# Patient Record
Sex: Male | Born: 1935 | Race: Black or African American | Hispanic: No | Marital: Single | Smoking: Never smoker
Health system: Southern US, Community
[De-identification: ages and names within clinical notes are randomized; demographics above are authoritative.]

## PROBLEM LIST (undated history)

## (undated) DIAGNOSIS — N189 Chronic kidney disease, unspecified: Secondary | ICD-10-CM

## (undated) DIAGNOSIS — I10 Essential (primary) hypertension: Secondary | ICD-10-CM

## (undated) DIAGNOSIS — F1010 Alcohol abuse, uncomplicated: Secondary | ICD-10-CM

## (undated) HISTORY — PX: FOOT SURGERY: SHX648

---

## 2007-01-07 ENCOUNTER — Inpatient Hospital Stay: Payer: Self-pay | Admitting: *Deleted

## 2007-01-07 ENCOUNTER — Other Ambulatory Visit: Payer: Self-pay

## 2008-03-31 ENCOUNTER — Other Ambulatory Visit: Payer: Self-pay

## 2008-03-31 ENCOUNTER — Emergency Department: Payer: Self-pay | Admitting: Emergency Medicine

## 2008-07-26 ENCOUNTER — Emergency Department: Payer: Self-pay | Admitting: Emergency Medicine

## 2009-07-16 ENCOUNTER — Emergency Department: Payer: Self-pay | Admitting: Emergency Medicine

## 2009-07-20 ENCOUNTER — Inpatient Hospital Stay: Payer: Self-pay | Admitting: Orthopedic Surgery

## 2009-10-08 ENCOUNTER — Emergency Department: Payer: Self-pay | Admitting: Unknown Physician Specialty

## 2012-10-23 LAB — COMPREHENSIVE METABOLIC PANEL
Albumin: 3.3 g/dL — ABNORMAL LOW (ref 3.4–5.0)
Anion Gap: 16 (ref 7–16)
BUN: 74 mg/dL — ABNORMAL HIGH (ref 7–18)
Bilirubin,Total: 0.7 mg/dL (ref 0.2–1.0)
Chloride: 102 mmol/L (ref 98–107)
Creatinine: 13.88 mg/dL — ABNORMAL HIGH (ref 0.60–1.30)
EGFR (African American): 3 — ABNORMAL LOW
EGFR (Non-African Amer.): 3 — ABNORMAL LOW
Glucose: 92 mg/dL (ref 65–99)
Potassium: 4.5 mmol/L (ref 3.5–5.1)
SGOT(AST): 34 U/L (ref 15–37)
Sodium: 134 mmol/L — ABNORMAL LOW (ref 136–145)
Total Protein: 7.6 g/dL (ref 6.4–8.2)

## 2012-10-23 LAB — CBC WITH DIFFERENTIAL/PLATELET
Basophil #: 0 10*3/uL (ref 0.0–0.1)
Basophil %: 0.5 %
Eosinophil %: 0.2 %
Lymphocyte %: 3.3 %
MCH: 32.8 pg (ref 26.0–34.0)
Monocyte #: 0.1 x10 3/mm — ABNORMAL LOW (ref 0.2–1.0)
Monocyte %: 1.2 %
Neutrophil %: 94.8 %
Platelet: 129 10*3/uL — ABNORMAL LOW (ref 150–440)
RDW: 13.3 % (ref 11.5–14.5)

## 2012-10-23 LAB — URINALYSIS, COMPLETE
Bacteria: NONE SEEN
Glucose,UR: NEGATIVE mg/dL (ref 0–75)
Hyaline Cast: 5
Ketone: NEGATIVE
Nitrite: NEGATIVE
Protein: NEGATIVE
RBC,UR: 1 /HPF (ref 0–5)
Specific Gravity: 1.005 (ref 1.003–1.030)
WBC UR: 3 /HPF (ref 0–5)

## 2012-10-24 ENCOUNTER — Inpatient Hospital Stay: Payer: Self-pay | Admitting: Internal Medicine

## 2012-10-24 LAB — COMPREHENSIVE METABOLIC PANEL
Albumin: 3 g/dL — ABNORMAL LOW (ref 3.4–5.0)
Alkaline Phosphatase: 77 U/L (ref 50–136)
Anion Gap: 18 — ABNORMAL HIGH (ref 7–16)
BUN: 80 mg/dL — ABNORMAL HIGH (ref 7–18)
Bilirubin,Total: 0.6 mg/dL (ref 0.2–1.0)
Chloride: 103 mmol/L (ref 98–107)
Co2: 14 mmol/L — ABNORMAL LOW (ref 21–32)
Creatinine: 14.4 mg/dL — ABNORMAL HIGH (ref 0.60–1.30)
EGFR (African American): 3 — ABNORMAL LOW
EGFR (Non-African Amer.): 3 — ABNORMAL LOW
Osmolality: 295 (ref 275–301)
Potassium: 5 mmol/L (ref 3.5–5.1)
SGPT (ALT): 25 U/L (ref 12–78)
Sodium: 135 mmol/L — ABNORMAL LOW (ref 136–145)
Total Protein: 7 g/dL (ref 6.4–8.2)

## 2012-10-24 LAB — BASIC METABOLIC PANEL
BUN: 88 mg/dL — ABNORMAL HIGH (ref 7–18)
Calcium, Total: 8 mg/dL — ABNORMAL LOW (ref 8.5–10.1)
Co2: 17 mmol/L — ABNORMAL LOW (ref 21–32)
EGFR (African American): 3 — ABNORMAL LOW
EGFR (Non-African Amer.): 3 — ABNORMAL LOW
Glucose: 155 mg/dL — ABNORMAL HIGH (ref 65–99)
Potassium: 4.4 mmol/L (ref 3.5–5.1)
Sodium: 136 mmol/L (ref 136–145)

## 2012-10-24 LAB — CBC WITH DIFFERENTIAL/PLATELET
Basophil #: 0 10*3/uL (ref 0.0–0.1)
HGB: 11.5 g/dL — ABNORMAL LOW (ref 13.0–18.0)
Lymphocyte #: 0.5 10*3/uL — ABNORMAL LOW (ref 1.0–3.6)
MCH: 32.4 pg (ref 26.0–34.0)
MCHC: 34.1 g/dL (ref 32.0–36.0)
MCV: 95 fL (ref 80–100)
Monocyte #: 0 x10 3/mm — ABNORMAL LOW (ref 0.2–1.0)
Monocyte %: 0.9 %
Neutrophil #: 5.1 10*3/uL (ref 1.4–6.5)
Neutrophil %: 90.3 %
RBC: 3.55 10*6/uL — ABNORMAL LOW (ref 4.40–5.90)

## 2012-10-24 LAB — IRON AND TIBC
Iron Saturation: 41 %
Iron: 90 ug/dL (ref 65–175)
Unbound Iron-Bind.Cap.: 129 ug/dL

## 2012-10-24 LAB — PROTEIN / CREATININE RATIO, URINE: Protein/Creat. Ratio: 203 mg/gCREAT — ABNORMAL HIGH (ref 0–200)

## 2012-10-24 LAB — PHOSPHORUS: Phosphorus: 7.8 mg/dL — ABNORMAL HIGH (ref 2.5–4.9)

## 2012-10-25 LAB — BASIC METABOLIC PANEL
Anion Gap: 15 (ref 7–16)
BUN: 99 mg/dL — ABNORMAL HIGH (ref 7–18)
BUN: 94 mg/dL — ABNORMAL HIGH (ref 7–18)
Calcium, Total: 7.5 mg/dL — ABNORMAL LOW (ref 8.5–10.1)
Chloride: 108 mmol/L — ABNORMAL HIGH (ref 98–107)
Chloride: 110 mmol/L — ABNORMAL HIGH (ref 98–107)
Co2: 16 mmol/L — ABNORMAL LOW (ref 21–32)
Creatinine: 12.85 mg/dL — ABNORMAL HIGH (ref 0.60–1.30)
Creatinine: 10.91 mg/dL — ABNORMAL HIGH (ref 0.60–1.30)
EGFR (African American): 4 — ABNORMAL LOW
EGFR (African American): 5 — ABNORMAL LOW
EGFR (Non-African Amer.): 3 — ABNORMAL LOW
EGFR (Non-African Amer.): 4 — ABNORMAL LOW
Glucose: 164 mg/dL — ABNORMAL HIGH (ref 65–99)
Glucose: 174 mg/dL — ABNORMAL HIGH (ref 65–99)
Osmolality: 310 (ref 275–301)
Osmolality: 314 (ref 275–301)
Potassium: 4.3 mmol/L (ref 3.5–5.1)
Sodium: 141 mmol/L (ref 136–145)

## 2012-10-25 LAB — PROTEIN ELECTROPHORESIS(ARMC)

## 2012-10-26 LAB — BASIC METABOLIC PANEL
Anion Gap: 13 (ref 7–16)
BUN: 94 mg/dL — ABNORMAL HIGH (ref 7–18)
Chloride: 108 mmol/L — ABNORMAL HIGH (ref 98–107)
Co2: 21 mmol/L (ref 21–32)
Creatinine: 9.29 mg/dL — ABNORMAL HIGH (ref 0.60–1.30)
Potassium: 3.6 mmol/L (ref 3.5–5.1)
Sodium: 142 mmol/L (ref 136–145)

## 2012-10-27 LAB — BASIC METABOLIC PANEL
BUN: 74 mg/dL — ABNORMAL HIGH (ref 7–18)
Chloride: 110 mmol/L — ABNORMAL HIGH (ref 98–107)
EGFR (African American): 11 — ABNORMAL LOW
Glucose: 87 mg/dL (ref 65–99)
Osmolality: 308 (ref 275–301)
Potassium: 3.2 mmol/L — ABNORMAL LOW (ref 3.5–5.1)

## 2012-10-28 LAB — BASIC METABOLIC PANEL
Anion Gap: 12 (ref 7–16)
BUN: 60 mg/dL — ABNORMAL HIGH (ref 7–18)
Co2: 21 mmol/L (ref 21–32)
Creatinine: 3.25 mg/dL — ABNORMAL HIGH (ref 0.60–1.30)
EGFR (African American): 20 — ABNORMAL LOW
Glucose: 93 mg/dL (ref 65–99)
Osmolality: 302 (ref 275–301)

## 2012-10-28 LAB — PLATELET COUNT: Platelet: 178 10*3/uL (ref 150–440)

## 2012-10-29 LAB — BASIC METABOLIC PANEL
BUN: 46 mg/dL — ABNORMAL HIGH (ref 7–18)
Calcium, Total: 7 mg/dL — CL (ref 8.5–10.1)
Co2: 23 mmol/L (ref 21–32)
EGFR (African American): 30 — ABNORMAL LOW
EGFR (Non-African Amer.): 26 — ABNORMAL LOW
Glucose: 114 mg/dL — ABNORMAL HIGH (ref 65–99)
Osmolality: 301 (ref 275–301)
Potassium: 3.6 mmol/L (ref 3.5–5.1)

## 2012-12-24 ENCOUNTER — Emergency Department: Payer: Self-pay | Admitting: Emergency Medicine

## 2013-10-14 ENCOUNTER — Emergency Department: Payer: Self-pay | Admitting: Emergency Medicine

## 2013-10-14 LAB — ETHANOL: Ethanol: 295 mg/dL

## 2013-10-14 LAB — URINALYSIS, COMPLETE
Bilirubin,UR: NEGATIVE
Glucose,UR: NEGATIVE mg/dL (ref 0–75)
Leukocyte Esterase: NEGATIVE
Ph: 5 (ref 4.5–8.0)
RBC,UR: NONE SEEN /HPF (ref 0–5)
Specific Gravity: 1.011 (ref 1.003–1.030)
Squamous Epithelial: NONE SEEN
WBC UR: NONE SEEN /HPF (ref 0–5)

## 2013-10-14 LAB — CBC
HCT: 40.4 % (ref 40.0–52.0)
HGB: 13.4 g/dL (ref 13.0–18.0)
MCHC: 33.1 g/dL (ref 32.0–36.0)
MCV: 93 fL (ref 80–100)
RBC: 4.36 10*6/uL — ABNORMAL LOW (ref 4.40–5.90)
WBC: 9.9 10*3/uL (ref 3.8–10.6)

## 2013-10-14 LAB — COMPREHENSIVE METABOLIC PANEL
Albumin: 3.7 g/dL (ref 3.4–5.0)
BUN: 23 mg/dL — ABNORMAL HIGH (ref 7–18)
Calcium, Total: 8.7 mg/dL (ref 8.5–10.1)
Co2: 24 mmol/L (ref 21–32)
Creatinine: 1.46 mg/dL — ABNORMAL HIGH (ref 0.60–1.30)
EGFR (African American): 52 — ABNORMAL LOW
Osmolality: 286 (ref 275–301)
Potassium: 4.3 mmol/L (ref 3.5–5.1)
SGOT(AST): 89 U/L — ABNORMAL HIGH (ref 15–37)
Sodium: 142 mmol/L (ref 136–145)

## 2013-10-14 LAB — DRUG SCREEN, URINE
Amphetamines, Ur Screen: NEGATIVE (ref ?–1000)
Barbiturates, Ur Screen: NEGATIVE (ref ?–200)
Benzodiazepine, Ur Scrn: NEGATIVE (ref ?–200)
Cannabinoid 50 Ng, Ur ~~LOC~~: NEGATIVE (ref ?–50)

## 2013-10-14 LAB — TROPONIN I: Troponin-I: <0.02 ng/mL

## 2013-10-16 ENCOUNTER — Emergency Department: Payer: Self-pay | Admitting: Emergency Medicine

## 2013-10-16 LAB — CBC WITH DIFFERENTIAL/PLATELET
Eosinophil #: 0.1 10*3/uL (ref 0.0–0.7)
HCT: 44 % (ref 40.0–52.0)
HGB: 14.3 g/dL (ref 13.0–18.0)
Lymphocyte %: 21.5 %
MCH: 30.1 pg (ref 26.0–34.0)
MCHC: 32.4 g/dL (ref 32.0–36.0)
MCV: 93 fL (ref 80–100)
Monocyte #: 0.5 x10 3/mm (ref 0.2–1.0)
Monocyte %: 5.6 %
Neutrophil #: 6.7 10*3/uL — ABNORMAL HIGH (ref 1.4–6.5)
Neutrophil %: 70.6 %
Platelet: 144 10*3/uL — ABNORMAL LOW (ref 150–440)
RBC: 4.74 10*6/uL (ref 4.40–5.90)
RDW: 16.1 % — ABNORMAL HIGH (ref 11.5–14.5)
WBC: 9.5 10*3/uL (ref 3.8–10.6)

## 2013-10-16 LAB — URINALYSIS, COMPLETE
Bilirubin,UR: NEGATIVE
Blood: NEGATIVE
Nitrite: NEGATIVE
Ph: 5 (ref 4.5–8.0)
RBC,UR: NONE SEEN /HPF (ref 0–5)
Specific Gravity: 1.013 (ref 1.003–1.030)
Squamous Epithelial: NONE SEEN
WBC UR: 1 /HPF (ref 0–5)

## 2013-10-16 LAB — BASIC METABOLIC PANEL
Anion Gap: 15 (ref 7–16)
Calcium, Total: 9.3 mg/dL (ref 8.5–10.1)
Chloride: 102 mmol/L (ref 98–107)
Co2: 20 mmol/L — ABNORMAL LOW (ref 21–32)
Osmolality: 274 (ref 275–301)
Potassium: 4.4 mmol/L (ref 3.5–5.1)
Sodium: 137 mmol/L (ref 136–145)

## 2013-10-16 LAB — TROPONIN I: Troponin-I: <0.02 ng/mL

## 2014-04-23 ENCOUNTER — Emergency Department: Payer: Self-pay | Admitting: Emergency Medicine

## 2014-04-23 LAB — COMPREHENSIVE METABOLIC PANEL
Albumin: 3.5 g/dL (ref 3.4–5.0)
Alkaline Phosphatase: 81 U/L
Anion Gap: 10 (ref 7–16)
BILIRUBIN TOTAL: 0.4 mg/dL (ref 0.2–1.0)
BUN: 11 mg/dL (ref 7–18)
CO2: 21 mmol/L (ref 21–32)
Calcium, Total: 8.4 mg/dL — ABNORMAL LOW (ref 8.5–10.1)
Chloride: 109 mmol/L — ABNORMAL HIGH (ref 98–107)
Creatinine: 1.37 mg/dL — ABNORMAL HIGH (ref 0.60–1.30)
EGFR (African American): 56 — ABNORMAL LOW
EGFR (Non-African Amer.): 48 — ABNORMAL LOW
Glucose: 83 mg/dL (ref 65–99)
Osmolality: 278 (ref 275–301)
Potassium: 3.4 mmol/L — ABNORMAL LOW (ref 3.5–5.1)
SGOT(AST): 25 U/L (ref 15–37)
SGPT (ALT): 13 U/L (ref 12–78)
SODIUM: 140 mmol/L (ref 136–145)
TOTAL PROTEIN: 8 g/dL (ref 6.4–8.2)

## 2014-04-23 LAB — URINALYSIS, COMPLETE
BACTERIA: NONE SEEN
BILIRUBIN, UR: NEGATIVE
Blood: NEGATIVE
GLUCOSE, UR: NEGATIVE mg/dL (ref 0–75)
Ketone: NEGATIVE
LEUKOCYTE ESTERASE: NEGATIVE
NITRITE: NEGATIVE
PH: 5 (ref 4.5–8.0)
Protein: NEGATIVE
RBC, UR: NONE SEEN /HPF (ref 0–5)
SQUAMOUS EPITHELIAL: NONE SEEN
Specific Gravity: 1.012 (ref 1.003–1.030)

## 2014-04-23 LAB — CBC WITH DIFFERENTIAL/PLATELET
BASOS PCT: 1.5 %
Basophil #: 0.1 10*3/uL (ref 0.0–0.1)
EOS PCT: 5.5 %
Eosinophil #: 0.5 10*3/uL (ref 0.0–0.7)
HCT: 39.2 % — ABNORMAL LOW (ref 40.0–52.0)
HGB: 12.6 g/dL — ABNORMAL LOW (ref 13.0–18.0)
Lymphocyte #: 2.7 10*3/uL (ref 1.0–3.6)
Lymphocyte %: 31.5 %
MCH: 25.4 pg — AB (ref 26.0–34.0)
MCHC: 32.3 g/dL (ref 32.0–36.0)
MCV: 79 fL — ABNORMAL LOW (ref 80–100)
MONO ABS: 0.5 x10 3/mm (ref 0.2–1.0)
Monocyte %: 6.1 %
NEUTROS ABS: 4.7 10*3/uL (ref 1.4–6.5)
Neutrophil %: 55.4 %
PLATELETS: 140 10*3/uL — AB (ref 150–440)
RBC: 4.98 10*6/uL (ref 4.40–5.90)
RDW: 14.5 % (ref 11.5–14.5)
WBC: 8.6 10*3/uL (ref 3.8–10.6)

## 2014-04-23 LAB — LIPASE, BLOOD: LIPASE: 259 U/L (ref 73–393)

## 2014-04-23 LAB — ETHANOL: Ethanol: <3 mg/dL

## 2014-11-13 ENCOUNTER — Ambulatory Visit: Payer: Self-pay | Admitting: Unknown Physician Specialty

## 2014-11-17 DIAGNOSIS — D518 Other vitamin B12 deficiency anemias: Secondary | ICD-10-CM | POA: Insufficient documentation

## 2015-02-24 NOTE — Discharge Summary (Signed)
PATIENT NAME:  Frank HartshornHOWARD, Ottis L MR#:  [id number] DATE OF BIRTH:  01-16-1931  DATE OF ADMISSION:  10/23/2012 DATE OF DISCHARGE:  10/29/2012  DISCHARGE DIAGNOSES: 1. Acute renal failure.  2. Angioedema.  3. Hypokalemia.  4. Metabolic acidosis.  5. Tobacco abuse.  6. Hypertension.   CONSULTANTS: Nephrology.   IMAGING STUDIES: Ultrasound of the kidneys showed no evidence of obstruction.   ADMITTING HISTORY AND PHYSICAL: Please see detailed history and physical dictated on 10/23/2012. In brief, this is a [age] year old African American male patient with past history of alcohol abuse, tobacco abuse and hypertension who was recently started on lisinopril presented with tongue and lip swelling. The patient was admitted to the hospital service for further management. The patient was also found to have acute renal failure with creatinine elevated to 14.   HOSPITAL COURSE:  1. Acute renal failure. This was thought to be secondary to lisinopril use and ATN. Presently work-up is underway for bilateral renal artery stenosis and the patient will follow up with Dr. Gilda CreaseSchnier of vascular surgery for outpatient work-up of his possible bilateral renal artery stenosis. The patient was started on IV fluids and slowly his creatinine has trended down to a place of 2.16, at the time of discharge, and is slowly improving.  2. The patient will follow up with nephrology as outpatient and needs repeat basic metabolic panel in 4 to 5 days after discharge for follow-up. The patient's lisinopril has been held and he is started on Norvasc and his blood pressure is well controlled at the time of discharge.  3. Angioedema. This was secondary to ACE inhibitors, which has resolved after stopping the lisinopril.  4. Hypertension, well controlled during the hospital stay.    On the day of discharge, the patient does not have any pain or shortness of breath. His vitals show a temperature of 98.7, blood pressure 126/76 and saturating  96% on room air, and is being discharged back to his group home. His family did want the patient to go to assisted living facility, but the patient wanted to return to his group home and he can make his own decisions and is being discharged back to his group home.   DISCHARGE MEDICATIONS: 1. Norvasc 10 mg oral once a day.  2. Benadryl 25 mg oral 3 times a day as needed.  3. Ergocalciferol 50,000 international units 1 tablet oral once a week for 6 weeks.   DISCHARGE INSTRUCTIONS: Continue with low salt, renal diet. Activity as tolerated.  Follow up with Dr. Gilda CreaseSchnier, nephrology and primary care physician in 1 to 2 weeks. Repeat basic metabolic panel in 4 to 5 days after discharge.   TIME SPENT: Time spent today on this discharge activity was 40 minutes. ____________________________ Molinda BailiffSrikar R. Kriss Perleberg, MD srs:sb D: 10/29/2012 15:20:05 ET T: 10/30/2012 08:26:21 ET JOB#: [id number]  cc: Wardell HeathSrikar R. Elpidio AnisSudini, MD, <Dictator> Renford DillsGregory G. Schnier, MD Orie FishermanSRIKAR R Keshanna Riso MD ELECTRONICALLY SIGNED 11/01/2012 14:04

## 2015-02-27 NOTE — H&P (Signed)
PATIENT NAME:  Catarina HartshornHOWARD, Tai L MR#:  [id number] DATE OF BIRTH:  September 05, 1931  DATE OF ADMISSION:  10/23/2012  PRIMARY CARE PHYSICIAN: None.    HISTORY OF PRESENT ILLNESS: The patient is a 81 year old African American male with past medical history significant for history of alcohol abuse, history of shingles and chickenpox in the past, history of hypertension recently diagnosed who presented to the hospital with complaints of tongue and lip swelling. Apparently, the patient was taken [facility] or some other acute care facility on Thursday which was approximately 6 days ago. He was noted to have an upper respiratory infection, and he was noted to have elevated blood pressure. He was prescribed an ACE inhibitor as well as ciprofloxacin for a persistent cough. He took only 1 pill of ACE inhibitor, lisinopril, and the patient's family as well as the patient, himself, started noticing tongue as well as lip swelling. He was seen again at the same facility on Friday, 1 day after, and the patient was asked to stop taking ACE inhibitor and was prescribed Benadryl. He was continued, however, on ciprofloxacin; however, his swelling seemed to be worsening. Today, he was not able to speak well. He is slurring his speech. He is also not able to swallow well because of difficulty with swelling. He denies any significant pains; however, admits to having significant swelling in the submandibular area. He presented to the Emergency Room for further evaluation, and hospitalist services were contacted for admission.   PAST MEDICAL HISTORY: Significant for history of alcohol abuse, shingles, chickenpox, history of right tibia and fibula fracture as well as 4th and 5th metatarsal fracture on the right, status post ORIF of distal tibia by Dr. Rosita KeaMenz in September 2010, recent diagnosis of hypertension and questionable upper respiratory infection versus urinary tract infection.   MEDICATIONS: According to medical records,  the patient is on Benadryl 25 mg 3 times daily, ciprofloxacin XR 500 mg p.o. once daily, lisinopril 10 mg p.o. daily.   ALLERGIES: No other known allergies.   FAMILY HISTORY: The patient's father died of some type of cancer. The patient's mother died of unknown causes. The patient's mother could have had hypotension or hypertension. He is not sure about this. The patient's sister died of end-stage renal disease on hemodialysis which was felt to be due to diabetes mellitus. No other known family history.    SOCIAL HISTORY: The patient is a nonsmoker, although he dips snuff. He drinks approximately half to a quart of beer a day according to medical records. No history of alcohol withdrawal seizures.   REVIEW OF SYSTEMS: Difficult to obtain as the patient has difficulty speaking; however, he admits to having some postnasal drip as well as nasal drip, some cough. No significant phlegm production. Admits to having intermittent chest pains in his chest; however, denies any other symptoms such as fevers, chills, fatigue, weakness, pains, weight loss or gain.  EYES: In regards to eyes, denies any blurry vision, double vision, glaucoma or cataracts.  ENT: Denies any tinnitus, allergies, epistaxis, sinus pain, dentures or _____.  RESPIRATORY: Denies any wheezes, asthma or COPD.   CARDIOVASCULAR: Denies any orthopnea, edema, arrhythmias, palpitations or syncope. GASTROINTESTINAL: Denies nausea, vomiting, diarrhea or any change in bowel habits.  GENITOURINARY: Denies dysuria, hematuria, frequency or incontinence. ENDOCRINOLOGY: Denies any polydipsia, nocturia, thyroid problems, heat or cold intolerance or thirst.  HEMATOLOGY: Denies any anemia, easy bruisability, bleeding or swollen glands.  SKIN: Denies any acne, rashes, lesions or change in moles.  MUSCULOSKELETAL: Denies arthritis, cramps or swelling.  NEUROLOGIC: No numbness, epilepsy or tremor.  PSYCHIATRIC: Denies anxiety or insomnia.   PHYSICAL  EXAMINATION:  VITAL SIGNS: On arrival to the hospital, temperature 98.7, pulse 108, respiratory rate 22, blood pressure 121/80, saturation 100% on room air.  GENERAL: This is a well-developed, well-nourished, African American male in no significant distress, lying on the stretcher. He is somewhat uncomfortable because he is not able to speak well. He is slurring his speech, and he is somewhat nauseated and spitting out his sputum.  HEENT: His pupils are equal and reactive to light. Extraocular movements intact. No icterus or conjunctivitis. Has normal hearing. No pharyngeal erythema. Mucosa is moist.  NECK: No masses. Supple, nontender. Thyroid is not enlarged. No adenopathy. The patient does have swelling in the submandibular area under the chin mostly, and his tongue is somewhat swollen as well as his lips.   LUNGS: Clear to auscultation in all fields. No rales, rhonchi or wheezing. No labored inspirations, decreased effort or dullness to percussion. No overt respiratory distress.  CARDIOVASCULAR: S1, S2 appreciated. No murmurs, rubs or gallops were noted. PMI not lateralized. Chest is nontender to palpation.  EXTREMITIES: Pedal pulses 1+. No lower extremity edema, calf tenderness or cyanosis was noted.  ABDOMEN: Soft, nontender. Bowel sounds present. No hepatosplenomegaly or masses were noted.  _____.  MUSCULOSKELETAL: Able to move all extremities. No cyanosis, degenerative joint disease or kyphosis. Gait is not tested.  SKIN: Did not reveal any rashes, lesions, erythema, nodularity or induration. It was warm and dry to palpation.  LYMPH: No adenopathy in the cervical region. The patient does, however, have significant swelling in the tongue area and under his chin. No adenopathy otherwise in the cervical area.  NEUROLOGICAL: Cranial nerves grossly intact. Sensory is intact. No dysarthria or aphasia. The patient is alert, disoriented, poorly cooperative. Memory is significantly impaired.    LABORATORY DATA AND RADIOLOGIC STUDIES: Pending. Otherwise, no other studies were done.   ASSESSMENT AND PLAN:  1. Angioedema: Admit the patient to the medical floor. Start him on Solu-Medrol intravenous around-the-clock as well as Zantac and Benadryl intravenous. Will discontinue ciprofloxacin as well as ACE inhibitor which was already stopped. It is unclear which medication could have caused angioedema; however, ACE inhibitor usually is the culprit, and it started after he initiated this medication; however, unfortunately, it progressed over the past 6 days.  2. Dysphagia: Will get speech therapist evaluation. Will continue intravenous fluids for now.  3. Questionable upper respiratory infection: Will get chest x-ray. Exam seemed to be unremarkable.  4. Tobacco abuse: Nicotine patch. Discussed with the patient approximately 4 minutes.  5. Hypertension: The patient is not hypertensive at this time; however, we may need to initiate the patient on Norvasc or some other medication for blood pressure management if his blood pressure gets elevated with intravenous fluid administration.   TIME SPENT: 50 minutes.    ____________________________ Katharina Caper, MD rv:gb D: 10/23/2012 21:10:44 ET T: 10/24/2012 00:43:03 ET JOB#: [id number]  cc: Katharina Caper, MD, <Dictator> [place] Searles MD ELECTRONICALLY SIGNED 11/07/2012 20:38

## 2015-03-02 LAB — SURGICAL PATHOLOGY

## 2015-03-30 ENCOUNTER — Other Ambulatory Visit: Payer: Self-pay

## 2015-03-30 ENCOUNTER — Emergency Department: Payer: Medicare Other

## 2015-03-30 ENCOUNTER — Encounter: Payer: Self-pay | Admitting: Emergency Medicine

## 2015-03-30 ENCOUNTER — Emergency Department
Admission: EM | Admit: 2015-03-30 | Discharge: 2015-03-30 | Disposition: A | Discharge status: Home / Self Care | Payer: Medicare Other | Attending: Emergency Medicine | Admitting: Emergency Medicine

## 2015-03-30 DIAGNOSIS — Z880 Allergy status to penicillin: Secondary | ICD-10-CM | POA: Insufficient documentation

## 2015-03-30 DIAGNOSIS — R0789 Other chest pain: Secondary | ICD-10-CM | POA: Diagnosis not present

## 2015-03-30 DIAGNOSIS — R079 Chest pain, unspecified: Secondary | ICD-10-CM

## 2015-03-30 DIAGNOSIS — K8050 Calculus of bile duct without cholangitis or cholecystitis without obstruction: Secondary | ICD-10-CM | POA: Diagnosis not present

## 2015-03-30 DIAGNOSIS — R1011 Right upper quadrant pain: Secondary | ICD-10-CM | POA: Diagnosis present

## 2015-03-30 HISTORY — DX: Essential (primary) hypertension: I10

## 2015-03-30 LAB — COMPREHENSIVE METABOLIC PANEL
ALBUMIN: 4.1 g/dL (ref 3.5–5.0)
ALT: 15 U/L — ABNORMAL LOW (ref 17–63)
AST: 22 U/L (ref 15–41)
Alkaline Phosphatase: 90 U/L (ref 38–126)
Anion gap: 9 (ref 5–15)
BUN: 15 mg/dL (ref 6–20)
CO2: 24 mmol/L (ref 22–32)
Calcium: 8.7 mg/dL — ABNORMAL LOW (ref 8.9–10.3)
Chloride: 105 mmol/L (ref 101–111)
Creatinine, Ser: 1.43 mg/dL — ABNORMAL HIGH (ref 0.61–1.24)
GFR, EST AFRICAN AMERICAN: 51 mL/min — AB (ref >60)
GFR, EST NON AFRICAN AMERICAN: 44 mL/min — AB (ref >60)
Glucose, Bld: 80 mg/dL (ref 65–99)
Potassium: 3.4 mmol/L — ABNORMAL LOW (ref 3.5–5.1)
SODIUM: 138 mmol/L (ref 135–145)
TOTAL PROTEIN: 8.5 g/dL — AB (ref 6.5–8.1)
Total Bilirubin: 0.6 mg/dL (ref 0.3–1.2)

## 2015-03-30 LAB — URINALYSIS COMPLETE WITH MICROSCOPIC (ARMC ONLY)
BILIRUBIN URINE: NEGATIVE
Bacteria, UA: NONE SEEN
GLUCOSE, UA: NEGATIVE mg/dL
HGB URINE DIPSTICK: NEGATIVE
Ketones, ur: NEGATIVE mg/dL
LEUKOCYTES UA: NEGATIVE
Nitrite: NEGATIVE
Protein, ur: NEGATIVE mg/dL
SPECIFIC GRAVITY, URINE: 1.002 — AB (ref 1.005–1.030)
SQUAMOUS EPITHELIAL / LPF: NONE SEEN
pH: 5 (ref 5.0–8.0)

## 2015-03-30 LAB — CBC WITH DIFFERENTIAL/PLATELET
Basophils Absolute: 0.1 10*3/uL (ref 0–0.1)
Basophils Relative: 1 %
Eosinophils Absolute: 0.4 10*3/uL (ref 0–0.7)
Eosinophils Relative: 4 %
HEMATOCRIT: 46.6 % (ref 40.0–52.0)
HEMOGLOBIN: 15 g/dL (ref 13.0–18.0)
Lymphocytes Relative: 26 %
Lymphs Abs: 3.1 10*3/uL (ref 1.0–3.6)
MCH: 27 pg (ref 26.0–34.0)
MCHC: 32.2 g/dL (ref 32.0–36.0)
MCV: 84 fL (ref 80.0–100.0)
MONO ABS: 1 10*3/uL (ref 0.2–1.0)
MONOS PCT: 8 %
NEUTROS ABS: 7 10*3/uL — AB (ref 1.4–6.5)
Neutrophils Relative %: 61 %
Platelets: 128 10*3/uL — ABNORMAL LOW (ref 150–440)
RBC: 5.55 MIL/uL (ref 4.40–5.90)
RDW: 17.5 % — ABNORMAL HIGH (ref 11.5–14.5)
WBC: 11.6 10*3/uL — AB (ref 3.8–10.6)

## 2015-03-30 LAB — FIBRIN DERIVATIVES D-DIMER (ARMC ONLY): FIBRIN DERIVATIVES D-DIMER (ARMC): 1265 — AB (ref 0–499)

## 2015-03-30 LAB — TROPONIN I: Troponin I: <0.03 ng/mL (ref <0.031)

## 2015-03-30 LAB — LIPASE, BLOOD: Lipase: 57 U/L — ABNORMAL HIGH (ref 22–51)

## 2015-03-30 MED ORDER — ACETAMINOPHEN 500 MG PO TABS
1000.0000 mg | ORAL_TABLET | Freq: Once | ORAL | Status: AC
Start: 2015-03-30 — End: 2015-03-30
  Administered 2015-03-30: 1000 mg via ORAL

## 2015-03-30 MED ORDER — ACETAMINOPHEN 500 MG PO TABS
ORAL_TABLET | ORAL | Status: AC
  Filled 2015-03-30: qty 2

## 2015-03-30 NOTE — ED Notes (Signed)
Patient transported to Ultrasound 

## 2015-03-30 NOTE — ED Notes (Signed)
Patient has had some right side pain for a few months. Patient states that yesterday he started having the pain in his side when he breaths. Patient states that the pain became worse today. Patient denies any shortness of breath.

## 2015-03-30 NOTE — Discharge Instructions (Signed)
Biliary Colic  °Biliary colic is a steady or irregular pain in the upper abdomen. It is usually under the right side of the rib cage. It happens when gallstones interfere with the normal flow of bile from the gallbladder. Bile is a liquid that helps to digest fats. Bile is made in the liver and stored in the gallbladder. When you eat a meal, bile passes from the gallbladder through the cystic duct and the common bile duct into the small intestine. There, it mixes with partially digested food. If a gallstone blocks either of these ducts, the normal flow of bile is blocked. The muscle cells in the bile duct contract forcefully to try to move the stone. This causes the pain of biliary colic.  °SYMPTOMS  °· A person with biliary colic usually complains of pain in the upper abdomen. This pain can be: °· In the center of the upper abdomen just below the breastbone. °· In the upper-right part of the abdomen, near the gallbladder and liver. °· Spread back toward the right shoulder blade. °· Nausea and vomiting. °· The pain usually occurs after eating. °· Biliary colic is usually triggered by the digestive system's demand for bile. The demand for bile is high after fatty meals. Symptoms can also occur when a person who has been fasting suddenly eats a very large meal. Most episodes of biliary colic pass after 1 to 5 hours. After the most intense pain passes, your abdomen may continue to ache mildly for about 24 hours. °DIAGNOSIS  °After you describe your symptoms, your caregiver will perform a physical exam. He or she will pay attention to the upper right portion of your belly (abdomen). This is the area of your liver and gallbladder. An ultrasound will help your caregiver look for gallstones. Specialized scans of the gallbladder may also be done. Blood tests may be done, especially if you have fever or if your pain persists. °PREVENTION  °Biliary colic can be prevented by controlling the risk factors for gallstones. Some of  these risk factors, such as heredity, increasing age, and pregnancy are a normal part of life. Obesity and a high-fat diet are risk factors you can change through a healthy lifestyle. Women going through menopause who take hormone replacement therapy (estrogen) are also more likely to develop biliary colic. °TREATMENT  °· Pain medication may be prescribed. °· You may be encouraged to eat a fat-free diet. °· If the first episode of biliary colic is severe, or episodes of colic keep retuning, surgery to remove the gallbladder (cholecystectomy) is usually recommended. This procedure can be done through small incisions using an instrument called a laparoscope. The procedure often requires a brief stay in the hospital. Some people can leave the hospital the same day. It is the most widely used treatment in people troubled by painful gallstones. It is effective and safe, with no complications in more than 90% of cases. °· If surgery cannot be done, medication that dissolves gallstones may be used. This medication is expensive and can take months or years to work. Only small stones will dissolve. °· Rarely, medication to dissolve gallstones is combined with a procedure called shock-wave lithotripsy. This procedure uses carefully aimed shock waves to break up gallstones. In many people treated with this procedure, gallstones form again within a few years. °PROGNOSIS  °If gallstones block your cystic duct or common bile duct, you are at risk for repeated episodes of biliary colic. There is also a 25% chance that you will develop   a gallbladder infection(acute cholecystitis), or some other complication of gallstones within 10 to 20 years. If you have surgery, schedule it at a time that is convenient for you and at a time when you are not sick. HOME CARE INSTRUCTIONS   Drink plenty of clear fluids.  Avoid fatty, greasy or fried foods, or any foods that make your pain worse.  Take medications as directed. SEEK MEDICAL  CARE IF:   You develop a fever over 100.5 F (38.1 C).  Your pain gets worse over time.  You develop nausea that prevents you from eating and drinking.  You develop vomiting. SEEK IMMEDIATE MEDICAL CARE IF:   You have continuous or severe belly (abdominal) pain which is not relieved with medications.  You develop nausea and vomiting which is not relieved with medications.  You have symptoms of biliary colic and you suddenly develop a fever and shaking chills. This may signal cholecystitis. Call your caregiver immediately.  You develop a yellow color to your skin or the white part of your eyes (jaundice). Document Released: 03/27/2006 Document Revised: 01/16/2012 Document Reviewed: 06/05/2008 [facility] Patient Information 2015 [place], [place]. This information is not intended to replace advice given to you by your health care provider. Make sure you discuss any questions you have with your health care provider.  Abdominal Pain Many things can cause abdominal pain. Usually, abdominal pain is not caused by a disease and will improve without treatment. It can often be observed and treated at home. Your health care provider will do a physical exam and possibly order blood tests and X-rays to help determine the seriousness of your pain. However, in many cases, more time must pass before a clear cause of the pain can be found. Before that point, your health care provider may not know if you need more testing or further treatment. HOME CARE INSTRUCTIONS  Monitor your abdominal pain for any changes. The following actions may help to alleviate any discomfort you are experiencing:  Only take over-the-counter or prescription medicines as directed by your health care provider.  Do not take laxatives unless directed to do so by your health care provider.  Try a clear liquid diet (broth, tea, or water) as directed by your health care provider. Slowly move to a bland diet as tolerated. SEEK MEDICAL  CARE IF:  You have unexplained abdominal pain.  You have abdominal pain associated with nausea or diarrhea.  You have pain when you urinate or have a bowel movement.  You experience abdominal pain that wakes you in the night.  You have abdominal pain that is worsened or improved by eating food.  You have abdominal pain that is worsened with eating fatty foods.  You have a fever. SEEK IMMEDIATE MEDICAL CARE IF:   Your pain does not go away within 2 hours.  You keep throwing up (vomiting).  Your pain is felt only in portions of the abdomen, such as the right side or the left lower portion of the abdomen.  You pass bloody or black tarry stools. MAKE SURE YOU:  Understand these instructions.   Will watch your condition.   Will get help right away if you are not doing well or get worse.  Document Released: 08/03/2005 Document Revised: 10/29/2013 Document Reviewed: 07/03/2013 [facility] Patient Information 2015 [place], [place]. This information is not intended to replace advice given to you by your health care provider. Make sure you discuss any questions you have with your health care provider.  Please call Dr Egbert Garibaldi  the Surgeon. Tell the office that the ER doctor spoke to him and he wanted to work you in on Wednesday to be seen for the gall stones that most likkely caused your pain.  Please return for worse pain, fever or vomiting. Pleaseavoid fatty meals since fatty meals may bring on a gall bladder attack.

## 2015-03-30 NOTE — ED Notes (Signed)
pts bedside male friend states he was given 1000mg  tylenol this er admission.

## 2015-03-30 NOTE — ED Notes (Signed)
bp 130/80, pt in no pain at this time. Taken out via wheelchair for d/c.

## 2015-03-30 NOTE — ED Provider Notes (Signed)
[facility] Emergency Department Provider Note  ____________________________________________  Time seen: Approximately 6:34 AM  I have reviewed the triage vital signs and the nursing notes.   HISTORY  Chief Complaint Pain   HPI Frank Jefferson is a 81 y.o. male patient and family report he's not been feeling well for the last 3 days or so he developed pain in the right lower chest right upper abdomen yesterday in the morning became rather severe although right now it just mild seems sharp and stabbing made worse by deep breathing patient has not had anything like this before patient denies any fever chills nausea vomiting patient has had intermittent diarrhea for many years. Patient has not been short of breath   No past medical history on file.  There are no active problems to display for this patient.   No past surgical history on file.  No current outpatient prescriptions on file.  Allergies Penicillins  History reviewed. No pertinent family history.  Social History History  Substance Use Topics  . Smoking status: Never Smoker   . Smokeless tobacco: Current User  . Alcohol Use: Yes     Comment: occasional    Review of Systems  Constitutional: No fever/chills Eyes: No visual changes. ENT: No sore throat. Cardiovascular: Denies chest pain. Respiratory: Denies shortness of breath. Gastrointestinal: No abdominal pain.  No nausea, no vomiting.  No diarrhea.  No constipation. Genitourinary: Negative for dysuria. Musculoskeletal: Negative for back pain. Skin: Negative for rash. Neurological: Negative for headaches, focal weakness or numbness.  10-point ROS otherwise negative.  ____________________________________________   PHYSICAL EXAM:  VITAL SIGNS: ED Triage Vitals  Enc Vitals Group     BP 03/30/15 0017 143/78 mmHg     Pulse Rate 03/30/15 0017 68     Resp 03/30/15 0017 18     Temp 03/30/15 0017 97.6 F (36.4 C)     Temp Source  03/30/15 0017 Oral     SpO2 03/30/15 0017 96 %     Weight 03/30/15 0017 140 lb (63.504 kg)     Height 03/30/15 0017 5\' 4"  (1.626 m)     Head Cir --      Peak Flow --      Pain Score 03/30/15 0018 8     Pain Loc --      Pain Edu? --      Excl. in GC? --     Constitutional: Alert and oriented. Well appearing and in no acute distress. Eyes: Conjunctivae are normal. PERRL. EOMI. Head: Atraumatic. Nose: No congestion/rhinnorhea. Mouth/Throat: Mucous membranes are moist.  Oropharynx non-erythematous. Neck: No stridor Cardiovascular: Normal rate, regular rhythm. Grossly normal heart sounds.  Good peripheral circulation. Respiratory: Normal respiratory effort.  No retractions. Lungs CTAB. Gastrointestinal: Soft and nontender. No distention. No abdominal bruits. No CVA tenderness. Musculoskeletal: No lower extremity tenderness nor edema.  No joint effusions. Neurologic:  Normal speech and language. No gross focal neurologic deficits are appreciated. Speech is normal. No gait instability. Skin:  Skin is warm, dry and intact. No rash noted. Psychiatric: Mood and affect are normal. Speech and behavior are normal.  ____________________________________________   LABS (all labs ordered are listed, but only abnormal results are displayed)  Labs Reviewed  CBC WITH DIFFERENTIAL/PLATELET - Abnormal; Notable for the following:    WBC 11.6 (*)    RDW 17.5 (*)    Platelets 128 (*)    Neutro Abs 7.0 (*)    All other components within normal limits  COMPREHENSIVE  METABOLIC PANEL - Abnormal; Notable for the following:    Potassium 3.4 (*)    Creatinine, Ser 1.43 (*)    Calcium 8.7 (*)    Total Protein 8.5 (*)    ALT 15 (*)    GFR calc non Af Amer 44 (*)    GFR calc Af Amer 51 (*)    All other components within normal limits  LIPASE, BLOOD - Abnormal; Notable for the following:    Lipase 57 (*)    All other components within normal limits  URINALYSIS COMPLETEWITH MICROSCOPIC ([facility])  -  Abnormal; Notable for the following:    Color, Urine STRAW (*)    APPearance CLEAR (*)    Specific Gravity, Urine 1.002 (*)    All other components within normal limits  TROPONIN I  FIBRIN DERIVATIVES D-DIMER [facility])   ____________________________________________  EKG  EKG read by me normal sinus rhythm at a rate of 63 first degree AV block left axis poor R-wave progression and no acute ST-T wave changes seen however ____________________________________________  RADIOLOGY chest x-ray is read as only atelectasis worse on the right ____________________________________________   PROCEDURES  Procedure(s) performed: None  Critical Care performed: No  ____________________________________________   INITIAL IMPRESSION / ASSESSMENT AND PLAN / ED COURSE  Pertinent labs & imaging results that were available during my care of the patient were reviewed by me and considered in my medical decision making (see chart for details). Gallbladder ultrasound shows multiple gallstones in the gallbladder no sign of acute cholecystitis patient's pain has completely resolved. Discussed with Dr. Colette Ribas he wants to follow up the patient in the office feel that this pain is most likely due to biliary colic the patient and the pain was right over where the gallbladder was while the patient was having an   ____________________________________________   FINAL CLINICAL IMPRESSION(S) / ED DIAGNOSES  Final diagnoses:  Biliary colic     Arnaldo Natal, MD 03/30/15 [phone]

## 2015-04-23 ENCOUNTER — Other Ambulatory Visit: Payer: Self-pay | Admitting: Internal Medicine

## 2015-04-23 DIAGNOSIS — R634 Abnormal weight loss: Secondary | ICD-10-CM

## 2015-04-27 ENCOUNTER — Ambulatory Visit
Admission: RE | Admit: 2015-04-27 | Discharge: 2015-04-27 | Disposition: A | Discharge status: Home / Self Care | Payer: Medicare Other | Source: Ambulatory Visit | Attending: Internal Medicine | Admitting: Internal Medicine

## 2015-04-27 DIAGNOSIS — K8020 Calculus of gallbladder without cholecystitis without obstruction: Secondary | ICD-10-CM | POA: Insufficient documentation

## 2015-04-27 DIAGNOSIS — R634 Abnormal weight loss: Secondary | ICD-10-CM

## 2015-04-28 DIAGNOSIS — K8020 Calculus of gallbladder without cholecystitis without obstruction: Secondary | ICD-10-CM | POA: Insufficient documentation

## 2015-04-28 DIAGNOSIS — K529 Noninfective gastroenteritis and colitis, unspecified: Secondary | ICD-10-CM | POA: Insufficient documentation

## 2015-04-29 ENCOUNTER — Other Ambulatory Visit: Payer: Self-pay | Admitting: Nurse Practitioner

## 2015-04-29 DIAGNOSIS — R1011 Right upper quadrant pain: Secondary | ICD-10-CM

## 2015-04-29 DIAGNOSIS — R630 Anorexia: Secondary | ICD-10-CM

## 2015-04-30 ENCOUNTER — Ambulatory Visit (INDEPENDENT_AMBULATORY_CARE_PROVIDER_SITE_OTHER): Payer: Medicare Other | Admitting: Surgery

## 2015-04-30 ENCOUNTER — Encounter: Payer: Self-pay | Admitting: Surgery

## 2015-04-30 VITALS — BP 126/92 | HR 76 | Temp 98.5°F | Resp 18

## 2015-04-30 DIAGNOSIS — K8020 Calculus of gallbladder without cholecystitis without obstruction: Secondary | ICD-10-CM

## 2015-04-30 NOTE — Progress Notes (Signed)
Surgical Consultation  04/30/2015  Frank [facility] is an 81 y.o. male.   Chief Complaint  Patient presents with  . Follow-up    possible sx consult     HPI: He returns for evaluation of his biliary tract disease. He has a history of right upper quadrant right lower quadrant pain which is defined diagnosis today. He's had multiple previous procedures looking for stone disease or possible colitis. Recent CT scan in January demonstrated gallstones diverticulosis without evidence of renal obstruction. He had an ultrasound which did demonstrate multiple gallstones without evidence of cholecystitis. There is no biliary ductal dilatation. His liver function studies were largely unremarkable. He did not have an elevated white blood cell count. He did have slightly elevated creatinine. He has been seen by the GI service and they are considering an upper endoscopy. He was referred to our office for consideration of possible biliary tract disease. Because of the disparity of his symptoms and his imaging findings, gallbladder surgery was not recommended at our last evaluation. He has continued to have symptoms returns for further consideration.  He continues to have abdominal pain point his right lower quadrant right hip area and right flank. He denies any upper quadrant pain although his caregiver points to the right upper quadrant and says that that area produces most of his complaints. Most of his discomfort is positional. He denies any GI related symptoms at all. He has no nausea or vomiting and is eating normally. He does have some mild diarrhea but no significant bloating.  He is being seen for prostate disease next week and then reevaluated by his primary care physician.  Past Medical History  Diagnosis Date  . Hypertension     No past surgical history on file.  Family History  Problem Relation Age of Onset  . Hypertension Mother   . Cancer Mother   . Cancer Brother     Social History:   reports that he has never smoked. He uses smokeless tobacco. He reports that he drinks alcohol. He reports that he does not use illicit drugs.  Allergies:  Allergies  Allergen Reactions  . Ace Inhibitors Anaphylaxis  . Ciprofloxacin Anaphylaxis  . Penicillins Anaphylaxis    Medications reviewed.     Review of Systems  Eyes: Negative.   Respiratory: Negative for cough, shortness of breath and wheezing.   Cardiovascular: Negative for chest pain, palpitations and leg swelling.  Gastrointestinal: Positive for heartburn, abdominal pain and diarrhea. Negative for nausea and vomiting.  Genitourinary: Positive for urgency and frequency. Negative for dysuria.  Musculoskeletal: Positive for myalgias and back pain. Negative for neck pain.  Skin: Negative.   Neurological: Positive for weakness. Negative for dizziness, sensory change, speech change and focal weakness.  Endo/Heme/Allergies: Negative.   Psychiatric/Behavioral: Positive for memory loss. Negative for substance abuse. The patient is not nervous/anxious.        BP 126/92 mmHg  Pulse 76  Temp(Src) 98.5 F (36.9 C) (Oral)  Resp 18  Physical Exam  Constitutional: He is oriented to person, place, and time and well-developed, well-nourished, and in no distress.  HENT:  Head: Normocephalic and atraumatic.  Eyes: Conjunctivae are normal. Pupils are equal, round, and reactive to light.  Neck: Normal range of motion. Neck supple.  Cardiovascular: Normal rate and regular rhythm.   Pulmonary/Chest: Effort normal and breath sounds normal.  Abdominal: Soft. Bowel sounds are normal. There is no tenderness. There is no rebound and no guarding.  Musculoskeletal: Normal range of motion. He  exhibits no edema.  Neurological: He is alert and oriented to person, place, and time.  Skin: Skin is warm and dry.  Psychiatric: Mood and affect normal.      No results found for this or any previous visit (from the past 48 hour(s)). No results  found.  Assessment/Plan: 1. Calculus of gallbladder without cholecystitis without obstruction I do not know whether he has biliary symptoms or not. He is very vague about the quality effusion of his pain. He does not have any GI related symptoms other than some mild diarrhea. He does have gallstones on his workup but it seems unlikely that all of his symptoms are related to his biliary tract. He is undergoing prostate evaluation and then will be seen by his primary care doctor. I talk with he and his caregiver. We will certainly be available to continue to follow him at the present time I do not believe his gallbladder so source of his problem. Most of his discomfort appears to be related to position and movement. They do not want to consider surgery less absolutely necessary.   Tiney Rouge III dermatitis

## 2015-05-05 ENCOUNTER — Ambulatory Visit
Admission: RE | Admit: 2015-05-05 | Discharge: 2015-05-05 | Disposition: A | Discharge status: Home / Self Care | Payer: Medicare Other | Source: Ambulatory Visit | Attending: Nurse Practitioner | Admitting: Nurse Practitioner

## 2015-05-05 DIAGNOSIS — K219 Gastro-esophageal reflux disease without esophagitis: Secondary | ICD-10-CM | POA: Diagnosis not present

## 2015-05-05 DIAGNOSIS — R1031 Right lower quadrant pain: Secondary | ICD-10-CM | POA: Insufficient documentation

## 2015-05-05 DIAGNOSIS — R1011 Right upper quadrant pain: Secondary | ICD-10-CM

## 2015-05-05 DIAGNOSIS — R630 Anorexia: Secondary | ICD-10-CM

## 2015-05-05 DIAGNOSIS — K228 Other specified diseases of esophagus: Secondary | ICD-10-CM | POA: Diagnosis not present

## 2015-05-05 DIAGNOSIS — R634 Abnormal weight loss: Secondary | ICD-10-CM | POA: Diagnosis not present

## 2015-06-30 DIAGNOSIS — R1031 Right lower quadrant pain: Secondary | ICD-10-CM | POA: Insufficient documentation

## 2015-12-01 ENCOUNTER — Other Ambulatory Visit: Payer: Self-pay | Admitting: Internal Medicine

## 2015-12-01 ENCOUNTER — Ambulatory Visit
Admission: RE | Admit: 2015-12-01 | Discharge: 2015-12-01 | Disposition: A | Discharge status: Home / Self Care | Payer: Medicare Other | Source: Ambulatory Visit | Attending: Internal Medicine | Admitting: Internal Medicine

## 2015-12-01 DIAGNOSIS — J90 Pleural effusion, not elsewhere classified: Secondary | ICD-10-CM | POA: Insufficient documentation

## 2015-12-01 DIAGNOSIS — R079 Chest pain, unspecified: Secondary | ICD-10-CM | POA: Insufficient documentation

## 2016-03-27 ENCOUNTER — Emergency Department
Admission: EM | Admit: 2016-03-27 | Discharge: 2016-03-27 | Disposition: A | Discharge status: Home / Self Care | Payer: Medicare Other | Attending: Emergency Medicine | Admitting: Emergency Medicine

## 2016-03-27 ENCOUNTER — Encounter: Payer: Self-pay | Admitting: Emergency Medicine

## 2016-03-27 ENCOUNTER — Emergency Department: Payer: Medicare Other

## 2016-03-27 DIAGNOSIS — Z79899 Other long term (current) drug therapy: Secondary | ICD-10-CM | POA: Insufficient documentation

## 2016-03-27 DIAGNOSIS — J069 Acute upper respiratory infection, unspecified: Secondary | ICD-10-CM

## 2016-03-27 DIAGNOSIS — M25511 Pain in right shoulder: Secondary | ICD-10-CM | POA: Diagnosis present

## 2016-03-27 DIAGNOSIS — I10 Essential (primary) hypertension: Secondary | ICD-10-CM | POA: Insufficient documentation

## 2016-03-27 MED ORDER — BENZONATATE 100 MG PO CAPS: 100.0000 mg | ORAL_CAPSULE | Freq: Three times a day (TID) | ORAL | Status: DC | PRN

## 2016-03-27 MED ORDER — LORATADINE 10 MG PO TABS: 10.0000 mg | ORAL_TABLET | Freq: Every day | ORAL | Status: DC

## 2016-03-27 NOTE — ED Provider Notes (Signed)
[facility] Provider Note  ____________________________________________  Time seen: Approximately 10:46 AM  I have reviewed the triage vital signs and the nursing notes.   HISTORY  Chief Complaint Shoulder Pain    HPI Frank HartshornRobert L Jefferson is a 82 y.o. male , NAD, presents to the emergency department with 3 day history of right shoulder pain. Patient is accompanied by a family member at the bedside who assists with history. Patient states he woke with shoulder pain approximately 3 days ago. States it can start in his proximal right shoulder and radiating into the mid upper arm. States it feels like an ache. Has taken Tylenol which seems to help with the pain but has not alleviated his pain. Has not had any numbness, weakness, tingling. Denies any injuries, traumas, falls.Denies chest pain, shortness of breath, back pain, neck pain, headaches, visual changes, abdominal pain, nausea, vomiting. Denies any back pain. Has not noted any redness, swelling, skin sores.   Family member accompanying the patient notes that the patient has had cough for approximately one week. Has not been taking anything over-the-counter for his symptoms. Denies any nasal congestion, runny nose, sneezing, sinus pressure, sore throat, ear pain. Has not had any significant chest congestion. Again as per above patient has not had any chest pain, back pain, shortness breath, wheezing or difficulty breathing. Has had no chills, fevers, body aches, weakness or change in appetite.   Past Medical History  Diagnosis Date  . Hypertension     There are no active problems to display for this patient.   History reviewed. No pertinent past surgical history.  Current Outpatient Rx  Name  Route  Sig  Dispense  Refill  . amLODipine-valsartan (EXFORGE) 10-160 MG tablet   Oral   Take 1 tablet by mouth daily.         . carvedilol (COREG) 6.25 MG tablet   Oral   Take 6.25 mg by mouth 2  (two) times daily with a meal.         . Cholecalciferol (VITAMIN D3) 1000 UNITS CAPS   Oral   Take by mouth.         . ferrous sulfate 325 (65 FE) MG tablet   Oral   Take by mouth.         . nystatin-triamcinolone (MYCOLOG II) cream   Topical   Apply topically.           Allergies Ace inhibitors; Ciprofloxacin; and Penicillins  Family History  Problem Relation Age of Onset  . Hypertension Mother   . Cancer Mother   . Cancer Brother     Social History Social History  Substance Use Topics  . Smoking status: Never Smoker   . Smokeless tobacco: Current User  . Alcohol Use: Yes     Comment: occasional     Review of Systems  Constitutional: No fever/chills, fatigue Eyes: No visual changes.  ENT:  No nasal congestion, runny nose, sneezing, sinus pressure, ear pain, sore throat. Cardiovascular: No chest pain, Palpitations. Respiratory: Positive cough. No shortness of breath. No wheezing.  Gastrointestinal: No abdominal pain.  No nausea, vomiting.  No diarrhea.  Musculoskeletal: Positive right shoulder pain. Negative for back pain.  Skin: Negative for rash. Neurological: Negative for headaches, focal weakness or numbness. No tingling. 10-point ROS otherwise negative.  ____________________________________________   PHYSICAL EXAM:  VITAL SIGNS: ED Triage Vitals  Enc Vitals Group     BP 03/27/16 1033 124/80 mmHg     Pulse  Rate 03/27/16 1033 67     Resp 03/27/16 1033 18     Temp 03/27/16 1033 98 F (36.7 C)     Temp Source 03/27/16 1033 Oral     SpO2 03/27/16 1033 98 %     Weight 03/27/16 1033 142 lb (64.411 kg)     Height 03/27/16 1033  (1.575 m)     Head Cir --      Peak Flow --      Pain Score 03/27/16 1040 7     Pain Loc --      Pain Edu? --      Excl. in GC? --      Constitutional: Alert and oriented. Well appearing and in no acute distress. Eyes: Conjunctivae are normal. Arcus senilis noted. Head: Atraumatic. Neck: No cervical spine  tenderness to palpation. No step-offs or other bony deformities noted about palpation of cervical spine. Supple with full range of motion. Mild tenderness to palpation about the right trapezius but no muscle spasm appreciated. Hematological/Lymphatic/Immunilogical: No cervical lymphadenopathy. Cardiovascular: Normal rate, regular rhythm. Grossly normal heart sounds noted. Good peripheral circulation with 2+ pulses noted in bilateral upper extremities. Respiratory: Normal respiratory effort without tachypnea or retractions. Lungs CTAB with breath sounds noted in all lung fields Musculoskeletal: Full range of motion of the right shoulder without pain. No tenderness to palpation about the right shoulder. No AC joint tenderness or abnormality to palpation. No pain about the right clavicle. No joint effusions. Neurologic:  Normal speech and language. No gross focal neurologic deficits are appreciated.  Normal gait and posture. Skin:  Skin is warm, dry and intact. No rash redness, swelling, bruising, skin sores noted. Psychiatric: Mood and affect are normal. Speech and behavior are normal. Patient exhibits appropriate insight and judgement.   ____________________________________________   LABS  None ____________________________________________  EKG  None ____________________________________________  RADIOLOGY I have personally viewed and evaluated these images (plain radiographs) as part of my medical decision making, as well as reviewing the written report by the radiologist.  Dg Shoulder Right  03/27/2016  CLINICAL DATA:  Right shoulder pain EXAM: RIGHT SHOULDER - 2+ VIEW COMPARISON:  None. FINDINGS: No fracture or dislocation is seen. Mild degenerative changes of the glenohumeral joint. Visualized right lung is clear. IMPRESSION: No fracture or dislocation is seen. Mild degenerative changes. Electronically Signed   By: Charline Bills M.D.   On: 03/27/2016 11:49     ____________________________________________    PROCEDURES  Procedure(s) performed: None    Medications - No data to display   ____________________________________________   INITIAL IMPRESSION / ASSESSMENT AND PLAN / ED COURSE  Pertinent imaging results that were available during my care of the patient were reviewed by me and considered in my medical decision making (see chart for details).  Patient's diagnosis is consistent with right shoulder arthralgia and upper respiratory infection. Patient will be discharged with home care instructions to continue Tylenol as needed for pain. Patient was given a prescription for Tessalon Perles and loratadine to take as needed for cough. Patient was given information in regards to arthralgia of the right shoulder and exercises to complete to keep his full range of motion intact.  Patient is to follow up with  Dr. Rosita Kea in orthopedics or his primary care provider if shoulder pain persist past this treatment course. Patient should follow up with his primary care provider if cough is not alleviated over the course of the next week. Patient and his family at the bedside is  given ED precautions to return to the ED for any worsening or new symptoms.    ____________________________________________  FINAL CLINICAL IMPRESSION(S) / ED DIAGNOSES  Final diagnoses:  Arthralgia of right shoulder region      NEW MEDICATIONS STARTED DURING THIS VISIT:  New Prescriptions   No medications on file         Hope Pigeon, PA-C 03/27/16 1158  Sharyn Creamer, MD 03/27/16 1707

## 2016-03-27 NOTE — ED Notes (Signed)
States he developed pain from neck into right shoulder for the past 3 days w/o injury

## 2016-03-27 NOTE — Discharge Instructions (Signed)
Cryotherapy Cryotherapy is when you put ice on your injury. Ice helps lessen pain and puffiness (swelling) after an injury. Ice works the best when you start using it in the first 24 to 48 hours after an injury. HOME CARE  Put a dry or damp towel between the ice pack and your skin.  You may press gently on the ice pack.  Leave the ice on for no more than 10 to 20 minutes at a time.  Check your skin after 5 minutes to make sure your skin is okay.  Rest at least 20 minutes between ice pack uses.  Stop using ice when your skin loses feeling (numbness).  Do not use ice on someone who cannot tell you when it hurts. This includes small children and people with memory problems (dementia). GET HELP RIGHT AWAY IF:  You have white spots on your skin.  Your skin turns blue or pale.  Your skin feels waxy or hard.  Your puffiness gets worse. MAKE SURE YOU:   Understand these instructions.  Will watch your condition.  Will get help right away if you are not doing well or get worse.   This information is not intended to replace advice given to you by your health care provider. Make sure you discuss any questions you have with your health care provider.   Document Released: 04/11/2008 Document Revised: 01/16/2012 Document Reviewed: 06/16/2011 Elsevier Interactive Patient Education 2016 Elsevier Inc.  [employer] Therapy Heat therapy can help ease sore, stiff, injured, and tight muscles and joints. Heat relaxes your muscles, which may help ease your pain. Heat therapy should only be used on old, pre-existing, or long-lasting (chronic) injuries. Do not use heat therapy unless told by your doctor. HOW TO USE HEAT THERAPY There are several different kinds of heat therapy, including:  Moist heat pack.  Warm water bath.  Hot water bottle.  Electric heating pad.  Heated gel pack.  Heated wrap.  Electric heating pad. GENERAL HEAT THERAPY RECOMMENDATIONS   Do not sleep while using heat  therapy. Only use heat therapy while you are awake.  Your skin may turn pink while using heat therapy. Do not use heat therapy if your skin turns red.  Do not use heat therapy if you have new pain.  High heat or long exposure to heat can cause burns. Be careful when using heat therapy to avoid burning your skin.  Do not use heat therapy on areas of your skin that are already irritated, such as with a rash or sunburn. GET HELP IF:   You have blisters, redness, swelling (puffiness), or numbness.  You have new pain.  Your pain is worse. MAKE SURE YOU: 1. Understand these instructions. 2. Will watch your condition. 3. Will get help right away if you are not doing well or get worse.   This information is not intended to replace advice given to you by your health care provider. Make sure you discuss any questions you have with your health care provider.   Document Released: 01/16/2012 Document Revised: 11/14/2014 Document Reviewed: 12/17/2013 Elsevier Interactive Patient Education 2016 Elsevier Inc.  Shoulder Pain The shoulder is the joint that connects your arms to your body. The bones that form the shoulder joint include the upper arm bone (humerus), the shoulder blade (scapula), and the collarbone (clavicle). The top of the humerus is shaped like a ball and fits into a rather flat socket on the scapula (glenoid cavity). A combination of muscles and strong, fibrous tissues that  connect muscles to bones (tendons) support your shoulder joint and hold the ball in the socket. Small, fluid-filled sacs (bursae) are located in different areas of the joint. They act as cushions between the bones and the overlying soft tissues and help reduce friction between the gliding tendons and the bone as you move your arm. Your shoulder joint allows a wide range of motion in your arm. This range of motion allows you to do things like scratch your back or throw a ball. However, this range of motion also makes  your shoulder more prone to pain from overuse and injury. Causes of shoulder pain can originate from both injury and overuse and usually can be grouped in the following four categories:  Redness, swelling, and pain (inflammation) of the tendon (tendinitis) or the bursae (bursitis).  Instability, such as a dislocation of the joint.  Inflammation of the joint (arthritis).  Broken bone (fracture). HOME CARE INSTRUCTIONS   Apply ice to the sore area.  Put ice in a plastic bag.  Place a towel between your skin and the bag.  Leave the ice on for 15-20 minutes, 3-4 times per day for the first 2 days, or as directed by your health care provider.  Stop using cold packs if they do not help with the pain.  If you have a shoulder sling or immobilizer, wear it as long as your caregiver instructs. Only remove it to shower or bathe. Move your arm as little as possible, but keep your hand moving to prevent swelling.  Squeeze a soft ball or foam pad as much as possible to help prevent swelling.  Only take over-the-counter or prescription medicines for pain, discomfort, or fever as directed by your caregiver. SEEK MEDICAL CARE IF:   Your shoulder pain increases, or new pain develops in your arm, hand, or fingers.  Your hand or fingers become cold and numb.  Your pain is not relieved with medicines. SEEK IMMEDIATE MEDICAL CARE IF:  4. Your arm, hand, or fingers are numb or tingling. 5. Your arm, hand, or fingers are significantly swollen or turn white or blue. MAKE SURE YOU:  1. Understand these instructions. 2. Will watch your condition. 3. Will get help right away if you are not doing well or get worse.   This information is not intended to replace advice given to you by your health care provider. Make sure you discuss any questions you have with your health care provider.   Document Released: 08/03/2005 Document Revised: 11/14/2014 Document Reviewed: 02/16/2015 Elsevier Interactive  Patient Education 2016 Elsevier Inc.  Shoulder Range of Motion Exercises Shoulder range of motion (ROM) exercises are designed to keep the shoulder moving freely. They are often recommended for people who have shoulder pain. MOVEMENT EXERCISE When you are able, do this exercise 5-6 days per week, or as told by your health care provider. Work toward doing 2 sets of 10 swings. Pendulum Exercise How To Do This Exercise Lying Down  Lie face-down on a bed with your abdomen close to the side of the bed.  Let your arm hang over the side of the bed.  Relax your shoulder, arm, and hand.  Slowly and gently swing your arm forward and back. Do not use your neck muscles to swing your arm. They should be relaxed. If you are struggling to swing your arm, have someone gently swing it for you. When you do this exercise for the first time, swing your arm at a 15 degree angle for 15  seconds, or swing your arm 10 times. As pain lessens over time, increase the angle of the swing to 30-45 degrees.  Repeat steps 1-4 with the other arm. How To Do This Exercise While Standing  Stand next to a sturdy chair or table and hold on to it with your hand.  Bend forward at the waist.  Bend your knees slightly.  Relax your other arm and let it hang limp.  Relax the shoulder blade of the arm that is hanging and let it drop.  While keeping your shoulder relaxed, use body motion to swing your arm in small circles. The first time you do this exercise, swing your arm for about 30 seconds or 10 times. When you do it next time, swing your arm for a little longer.  Stand up tall and relax.  Repeat steps 1-7, this time changing the direction of the circles.  Repeat steps 1-8 with the other arm. STRETCHING EXERCISES Do these exercises 3-4 times per day on 5-6 days per week or as told by your health care provider. Work toward holding the stretch for 20 seconds. Stretching Exercise 1  Lift your arm straight out in front  of you.  Bend your arm 90 degrees at the elbow (right angle) so your forearm goes across your body and looks like the letter "L."  Use your other arm to gently pull the elbow forward and across your body.  Repeat steps 1-3 with the other arm. Stretching Exercise 2 You will need a towel or rope for this exercise. 6. Bend one arm behind your back with the palm facing outward. 7. Hold a towel with your other hand. 8. Reach the arm that holds the towel above your head, and bend that arm at the elbow. Your wrist should be behind your neck. 9. Use your free hand to grab the free end of the towel. 10. With the higher hand, gently pull the towel up behind you. 11. With the lower hand, pull the towel down behind you. 12. Repeat steps 1-6 with the other arm. STRENGTHENING EXERCISES Do each of these exercises at four different times of day (sessions) every day or as told by your health care provider. To begin with, repeat each exercise 5 times (repetitions). Work toward doing 3 sets of 12 repetitions or as told by your health care provider. Strengthening Exercise 1 You will need a light weight for this activity. As you grow stronger, you may use a heavier weight. 4. Standing with a weight in your hand, lift your arm straight out to the side until it is at the same height as your shoulder. 5. Bend your arm at 90 degrees so that your fingers are pointing to the ceiling. 6. Slowly raise your hand until your arm is straight up in the air. 7. Repeat steps 1-3 with the other arm. Strengthening Exercise 2 You will need a light weight for this activity. As you grow stronger, you may use a heavier weight. 1. Standing with a weight in your hand, gradually move your straight arm in an arc, starting at your side, then out in front of you, then straight up over your head. 2. Gradually move your other arm in an arc, starting at your side, then out in front of you, then straight up over your head. 3. Repeat steps  1-2 with the other arm. Strengthening Exercise 3 You will need an elastic band for this activity. As you grow stronger, gradually increase the size of the bands  or increase the number of bands that you use at one time. 1. While standing, hold an elastic band in one hand and raise that arm up in the air. 2. With your other hand, pull down the band until that hand is by your side. 3. Repeat steps 1-2 with the other arm.   This information is not intended to replace advice given to you by your health care provider. Make sure you discuss any questions you have with your health care provider.   Document Released: 07/23/2003 Document Revised: 03/10/2015 Document Reviewed: 10/20/2014 Elsevier Interactive Patient Education 2016 [employer].  [employer] Vaporizers Vaporizers may help relieve the symptoms of a cough and cold. They add moisture to the air, which helps mucus to become thinner and less sticky. This makes it easier to breathe and cough up secretions. Cool mist vaporizers do not cause serious burns like hot mist vaporizers, which may also be called steamers or humidifiers. Vaporizers have not been proven to help with colds. You should not use a vaporizer if you are allergic to mold. HOME CARE INSTRUCTIONS  Follow the package instructions for the vaporizer.  Do not use anything other than distilled water in the vaporizer.  Do not run the vaporizer all of the time. This can cause mold or bacteria to grow in the vaporizer.  Clean the vaporizer after each time it is used.  Clean and dry the vaporizer well before storing it.  Stop using the vaporizer if worsening respiratory symptoms develop.   This information is not intended to replace advice given to you by your health care provider. Make sure you discuss any questions you have with your health care provider.   Document Released: 07/21/2004 Document Revised: 10/29/2013 Document Reviewed: 03/13/2013 Elsevier Interactive Patient  Education 2016 Elsevier Inc.  Cough, Adult A cough helps to clear your throat and lungs. A cough may last only 2-3 weeks (acute), or it may last longer than 8 weeks (chronic). Many different things can cause a cough. A cough may be a sign of an illness or another medical condition. HOME CARE  Pay attention to any changes in your cough.  Take medicines only as told by your doctor.  If you were prescribed an antibiotic medicine, take it as told by your doctor. Do not stop taking it even if you start to feel better.  Talk with your doctor before you try using a cough medicine.  Drink enough fluid to keep your pee (urine) clear or pale yellow.  If the air is dry, use a cold steam vaporizer or humidifier in your home.  Stay away from things that make you cough at work or at home.  If your cough is worse at night, try using extra pillows to raise your head up higher while you sleep.  Do not smoke, and try not to be around smoke. If you need help quitting, ask your doctor.  Do not have caffeine.  Do not drink alcohol.  Rest as needed. GET HELP IF:  You have new problems (symptoms).  You cough up yellow fluid (pus).  Your cough does not get better after 2-3 weeks, or your cough gets worse.  Medicine does not help your cough and you are not sleeping well.  You have pain that gets worse or pain that is not helped with medicine.  You have a fever.  You are losing weight and you do not know why.  You have night sweats. GET HELP RIGHT AWAY IF:  You  cough up blood.  You have trouble breathing.  Your heartbeat is very fast.   This information is not intended to replace advice given to you by your health care provider. Make sure you discuss any questions you have with your health care provider.   Document Released: 07/07/2011 Document Revised: 07/15/2015 Document Reviewed: 12/31/2014 Elsevier Interactive Patient Education [employer] Elsevier Inc.

## 2016-04-15 ENCOUNTER — Emergency Department
Admission: EM | Admit: 2016-04-15 | Discharge: 2016-04-15 | Disposition: A | Discharge status: Home / Self Care | Payer: Medicare Other | Attending: Emergency Medicine | Admitting: Emergency Medicine

## 2016-04-15 ENCOUNTER — Encounter: Payer: Self-pay | Admitting: Emergency Medicine

## 2016-04-15 DIAGNOSIS — M5412 Radiculopathy, cervical region: Secondary | ICD-10-CM | POA: Insufficient documentation

## 2016-04-15 DIAGNOSIS — I10 Essential (primary) hypertension: Secondary | ICD-10-CM | POA: Diagnosis not present

## 2016-04-15 DIAGNOSIS — M25521 Pain in right elbow: Secondary | ICD-10-CM | POA: Diagnosis present

## 2016-04-15 DIAGNOSIS — Z79899 Other long term (current) drug therapy: Secondary | ICD-10-CM | POA: Insufficient documentation

## 2016-04-15 MED ORDER — TRAMADOL HCL 50 MG PO TABS: 50.0000 mg | ORAL_TABLET | Freq: Four times a day (QID) | ORAL | Status: DC | PRN

## 2016-04-15 NOTE — ED Notes (Signed)
Patient states his arm has been hurting for the last few weeks. States some burning to shoulder and hand sometimes feels like cold water is running on it. States he drank some alcohol tonight.

## 2016-04-15 NOTE — ED Provider Notes (Signed)
[facility] Emergency Department Provider Note  ____________________________________________    I have reviewed the triage vital signs and the nursing notes.   HISTORY  Chief Complaint Arm Pain    HPI Frank HartshornRobert L Pearman is a 82 y.o. male who presents with complaints of pain radiating from his right neck to his right elbow. He has had this pain for several weeks.He may have injured it during a fall several weeks ago. He denies weakness or numbness. He reports the pain comes and goes and is electrical in nature. He says it hits him like a "lightening bolt ". Patient has no chest pain     Past Medical History  Diagnosis Date  . Hypertension     There are no active problems to display for this patient.   History reviewed. No pertinent past surgical history.  Current Outpatient Rx  Name  Route  Sig  Dispense  Refill  . amLODipine-valsartan (EXFORGE) 10-160 MG tablet   Oral   Take 1 tablet by mouth daily.         . benzonatate (TESSALON PERLES) 100 MG capsule   Oral   Take 1 capsule (100 mg total) by mouth 3 (three) times daily as needed for cough.   21 capsule   0   . carvedilol (COREG) 6.25 MG tablet   Oral   Take 6.25 mg by mouth 2 (two) times daily with a meal.         . Cholecalciferol (VITAMIN D3) 1000 UNITS CAPS   Oral   Take by mouth.         . ferrous sulfate 325 (65 FE) MG tablet   Oral   Take by mouth.         . loratadine (CLARITIN) 10 MG tablet   Oral   Take 1 tablet (10 mg total) by mouth daily.   30 tablet   0   . nystatin-triamcinolone (MYCOLOG II) cream   Topical   Apply topically.         . traMADol (ULTRAM) 50 MG tablet   Oral   Take 1 tablet (50 mg total) by mouth every 6 (six) hours as needed.   20 tablet   0     Allergies Ace inhibitors; Ciprofloxacin; and Penicillins  Family History  Problem Relation Age of Onset  . Hypertension Mother   . Cancer Mother   . Cancer Brother     Social  History Social History  Substance Use Topics  . Smoking status: Never Smoker   . Smokeless tobacco: Current User  . Alcohol Use: Yes     Comment: every other month    Review of Systems  Constitutional: Negative for fever. Eyes: Negative for redness ENT: Negative for sore throat Cardiovascular: Negative for chest pain Respiratory: Negative for shortness of breath. Gastrointestinal: Negative for abdominal pain Genitourinary: Negative for dysuria. Musculoskeletal: As above Skin: Negative for rash. Neurological: Negative for focal weakness Psychiatric: no anxiety    ____________________________________________   PHYSICAL EXAM:  VITAL SIGNS: ED Triage Vitals  Enc Vitals Group     BP 04/15/16 2029 142/103 mmHg     Pulse Rate 04/15/16 2029 94     Resp 04/15/16 2029 18     Temp 04/15/16 2029 98.1 F (36.7 C)     Temp Source 04/15/16 2029 Oral     SpO2 04/15/16 2029 98 %     Weight 04/15/16 2029 140 lb (63.504 kg)     Height 04/15/16 2029 5'  7" (1.702 m)     Head Cir --      Peak Flow --      Pain Score 04/15/16 2240 3     Pain Loc --      Pain Edu? --      Excl. in GC? --      Constitutional: Alert and oriented. Well appearing and in no distress.  Eyes: Conjunctivae are normal. No erythema or injection ENT   Head: Normocephalic and atraumatic.   Mouth/Throat: Mucous membranes are moist. Cardiovascular: Normal rate, regular rhythm. Normal and symmetric distal pulses are present in the upper extremities.  Respiratory: Normal respiratory effort without tachypnea nor retractions. Breath sounds are clear and equal bilaterally.  Gastrointestinal: Soft and non-tender in all quadrants. No distention. There is no CVA tenderness. Genitourinary: deferred Musculoskeletal: Nontender with normal range of motion in all extremities. No lower extremity tenderness nor edema.Normal right upper extremity, normal pulses, compartments are soft, full range of motion, no tenderness  to palpation of the joints or muscles. I can replicate the pain by pulling traction on his right arm. No masses felt in the axilla Neurologic:  Normal speech and language. No gross focal neurologic deficits are appreciated. Skin:  Skin is warm, dry and intact. No rash noted. Psychiatric: Mood and affect are normal. Patient exhibits appropriate insight and judgment.  ____________________________________________    LABS (pertinent positives/negatives)  Labs Reviewed - No data to display  ____________________________________________   EKG  None  ____________________________________________    RADIOLOGY  None  ____________________________________________   PROCEDURES  Procedure(s) performed: none  Critical Care performed: none  ____________________________________________   INITIAL IMPRESSION / ASSESSMENT AND PLAN / ED COURSE  Pertinent labs & imaging results that were available during my care of the patient were reviewed by me and considered in my medical decision making (see chart for details).  Patient's presentation is most consistent with cervical radiculopathy. We will treat with analgesics, he will follow-up with his PCP if pain continues. Return precautions discussed  ____________________________________________   FINAL CLINICAL IMPRESSION(S) / ED DIAGNOSES  Final diagnoses:  Cervical radiculopathy          Jene Every, MD 04/15/16 2357

## 2016-04-15 NOTE — Discharge Instructions (Signed)

## 2016-04-15 NOTE — ED Notes (Signed)
Patient here stating that he fell on his right shoulder 2-3 weeks ago. Patient states that he was seen here and had x-rays and was told that his arm was not broken. Patient states that he continues to have pain to his right arm. Patient states that the pain medication that we gave him is not working. Patient with full range of motion to arm and shoulder. Patient states that he has been drinking etoh tonight.

## 2016-08-16 ENCOUNTER — Emergency Department
Admission: EM | Admit: 2016-08-16 | Discharge: 2016-08-16 | Disposition: A | Discharge status: Home / Self Care | Payer: Medicare Other | Attending: Emergency Medicine | Admitting: Emergency Medicine

## 2016-08-16 ENCOUNTER — Encounter: Payer: Self-pay | Admitting: *Deleted

## 2016-08-16 ENCOUNTER — Emergency Department: Payer: Medicare Other

## 2016-08-16 DIAGNOSIS — F17200 Nicotine dependence, unspecified, uncomplicated: Secondary | ICD-10-CM | POA: Insufficient documentation

## 2016-08-16 DIAGNOSIS — I10 Essential (primary) hypertension: Secondary | ICD-10-CM | POA: Diagnosis not present

## 2016-08-16 DIAGNOSIS — Z79899 Other long term (current) drug therapy: Secondary | ICD-10-CM | POA: Diagnosis not present

## 2016-08-16 DIAGNOSIS — M461 Sacroiliitis, not elsewhere classified: Secondary | ICD-10-CM

## 2016-08-16 DIAGNOSIS — M25551 Pain in right hip: Secondary | ICD-10-CM

## 2016-08-16 MED ORDER — TRAMADOL HCL 50 MG PO TABS: 50.0000 mg | ORAL_TABLET | Freq: Four times a day (QID) | ORAL | 0 refills | Status: DC | PRN

## 2016-08-16 MED ORDER — KETOROLAC TROMETHAMINE 30 MG/ML IJ SOLN
30.0000 mg | Freq: Once | INTRAMUSCULAR | Status: AC
  Administered 2016-08-16: 30 mg via INTRAMUSCULAR
  Filled 2016-08-16: qty 1

## 2016-08-16 NOTE — ED Triage Notes (Addendum)
Pt has right hip pain.  Sx for 2 days   No known injury.  Pt states pain in right hip when  walking. Pt admits to etoh use tonight.  Pt alert, calm and cooperative.

## 2016-08-16 NOTE — ED Provider Notes (Signed)
[facility] Emergency Department Provider Note   ____________________________________________   First MD Initiated Contact with Patient 08/16/16 0411     (approximate)  I have reviewed the triage vital signs and the nursing notes.   HISTORY  Chief Complaint Hip Pain    HPI Frank Jefferson is a 82 y.o. male who presents to the ED from home with a chief complaint of right hip pain. Patient reports nontraumatic right posterior hip pain for the past 2 days. Denies fall, trauma, injury. Reports pain in his posterior right hip mainly when bending over and sometimes when walking.Admits to having 2 beers last evening before midnight. Denies recent fever, chills, chest pain, shortness of breath, abdominal pain, nausea, vomiting, diarrhea, dysuria. Nothing makes his pain better. Bending over makes his pain worse.   Past Medical History:  Diagnosis Date  . Hypertension     There are no active problems to display for this patient.   No past surgical history on file.  Prior to Admission medications   Medication Sig Start Date End Date Taking? Authorizing Provider  amLODipine-valsartan (EXFORGE) 10-160 MG tablet Take 1 tablet by mouth daily.    Historical Provider, MD  benzonatate (TESSALON PERLES) 100 MG capsule Take 1 capsule (100 mg total) by mouth 3 (three) times daily as needed for cough. 03/27/16   Jami L Hagler, PA-C  carvedilol (COREG) 6.25 MG tablet Take 6.25 mg by mouth 2 (two) times daily with a meal.    Historical Provider, MD  Cholecalciferol (VITAMIN D3) 1000 UNITS CAPS Take by mouth.    Historical Provider, MD  ferrous sulfate 325 (65 FE) MG tablet Take by mouth.    Historical Provider, MD  loratadine (CLARITIN) 10 MG tablet Take 1 tablet (10 mg total) by mouth daily. 03/27/16   Jami L Hagler, PA-C  nystatin-triamcinolone (MYCOLOG II) cream Apply topically.    Historical Provider, MD  traMADol (ULTRAM) 50 MG tablet Take 1 tablet (50 mg total) by  mouth every 6 (six) hours as needed. 08/16/16   Irean Hong, MD    Allergies Ace inhibitors; Ciprofloxacin; and Penicillins  Family History  Problem Relation Age of Onset  . Hypertension Mother   . Cancer Mother   . Cancer Brother     Social History Social History  Substance Use Topics  . Smoking status: Never Smoker  . Smokeless tobacco: Current User  . Alcohol use Yes     Comment: every other month    Review of Systems  Constitutional: No fever/chills. Eyes: No visual changes. ENT: No sore throat. Cardiovascular: Denies chest pain. Respiratory: Denies shortness of breath. Gastrointestinal: No abdominal pain.  No nausea, no vomiting.  No diarrhea.  No constipation. Genitourinary: Negative for dysuria. Musculoskeletal: Positive for right hip pain. Negative for back pain. Skin: Negative for rash. Neurological: Negative for headaches, focal weakness or numbness.  10-point ROS otherwise negative.  ____________________________________________   PHYSICAL EXAM:  VITAL SIGNS: ED Triage Vitals  Enc Vitals Group     BP 08/16/16 0210 (!) 157/96     Pulse Rate 08/16/16 0210 92     Resp 08/16/16 0210 18     Temp 08/16/16 0210 98.8 F (37.1 C)     Temp Source 08/16/16 0210 Oral     SpO2 08/16/16 0210 97 %     Weight 08/16/16 0211 145 lb (65.8 kg)     Height 08/16/16 0211 5\' 7"  (1.702 m)     Head Circumference --  Peak Flow --      Pain Score 08/16/16 0215 10     Pain Loc --      Pain Edu? --      Excl. in GC? --     Constitutional: Alert and oriented. Well appearing and in no acute distress. Eyes: Conjunctivae are normal. PERRL. EOMI. Head: Atraumatic. Nose: No congestion/rhinnorhea. Mouth/Throat: Mucous membranes are moist.  Oropharynx non-erythematous. Neck: No stridor.  No cervical spine tenderness to palpation. Cardiovascular: Normal rate, regular rhythm. Grossly normal heart sounds.  Good peripheral circulation. Respiratory: Normal respiratory effort.   No retractions. Lungs CTAB. Gastrointestinal: Soft and nontender. No distention. No abdominal bruits. No CVA tenderness. Musculoskeletal: Right posterior hip tender to palpation at sacroiliac joint. Full range of motion with minimal pain. There is no shortening or rotation. 2+ femoral distal pulses.  Neurologic:  Normal speech and language. No gross focal neurologic deficits are appreciated. Ambulates with cane at baseline. Skin:  Skin is warm, dry and intact. No rash noted. Psychiatric: Mood and affect are normal. Speech and behavior are normal.  ____________________________________________   LABS (all labs ordered are listed, but only abnormal results are displayed)  Labs Reviewed - No data to display ____________________________________________  EKG  None ____________________________________________  RADIOLOGY  Right hip xrays (viewed by me, interpreted per Dr. Cherly Hensenhang): No evidence of fracture or dislocation. ____________________________________________   PROCEDURES  Procedure(s) performed: None  Procedures  Critical Care performed: No  ____________________________________________   INITIAL IMPRESSION / ASSESSMENT AND PLAN / ED COURSE  Pertinent labs & imaging results that were available during my care of the patient were reviewed by me and considered in my medical decision making (see chart for details).  82 year old male who presents with nontraumatic right SI joint pain. Will administer analgesia and patient will follow-up with his PCP this week. Strict return precautions given. Patient and family member verbalize understanding and agree with plan of care.  Clinical Course     ____________________________________________   FINAL CLINICAL IMPRESSION(S) / ED DIAGNOSES  Final diagnoses:  Right hip pain  SI (sacroiliac) joint inflammation (HCC)      NEW MEDICATIONS STARTED DURING THIS VISIT:  New Prescriptions   TRAMADOL (ULTRAM) 50 MG TABLET    Take 1  tablet (50 mg total) by mouth every 6 (six) hours as needed.     Note:  This document was prepared using Dragon voice recognition software and may include unintentional dictation errors.    Irean HongJade J Abbegayle Denault, MD 08/16/16 [phone]
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
## 2016-08-16 NOTE — Discharge Instructions (Signed)
1. You may take tramadol as needed for pain. 2. Apply moist heat to affected area several times daily. 3. Return to the ER for worsening symptoms, persistent vomiting, difficulty breathing or other concerns.

## 2016-08-16 NOTE — ED Notes (Signed)
Pt discharged to home.  Family member driving.  Discharge instructions reviewed.  Verbalized understanding.  No questions or concerns at this time.  Teach back verified.  Pt in NAD.  No items left in ED.   

## 2016-12-29 ENCOUNTER — Emergency Department
Admission: EM | Admit: 2016-12-29 | Discharge: 2016-12-30 | Disposition: A | Discharge status: Home / Self Care | Payer: Medicare Other | Attending: Emergency Medicine | Admitting: Emergency Medicine

## 2016-12-29 ENCOUNTER — Encounter: Payer: Self-pay | Admitting: *Deleted

## 2016-12-29 ENCOUNTER — Emergency Department: Payer: Medicare Other

## 2016-12-29 DIAGNOSIS — S0031XA Abrasion of nose, initial encounter: Secondary | ICD-10-CM | POA: Diagnosis not present

## 2016-12-29 DIAGNOSIS — Y939 Activity, unspecified: Secondary | ICD-10-CM | POA: Insufficient documentation

## 2016-12-29 DIAGNOSIS — F10120 Alcohol abuse with intoxication, uncomplicated: Secondary | ICD-10-CM | POA: Insufficient documentation

## 2016-12-29 DIAGNOSIS — Y929 Unspecified place or not applicable: Secondary | ICD-10-CM | POA: Insufficient documentation

## 2016-12-29 DIAGNOSIS — W1839XA Other fall on same level, initial encounter: Secondary | ICD-10-CM | POA: Insufficient documentation

## 2016-12-29 DIAGNOSIS — F10920 Alcohol use, unspecified with intoxication, uncomplicated: Secondary | ICD-10-CM

## 2016-12-29 DIAGNOSIS — Y999 Unspecified external cause status: Secondary | ICD-10-CM | POA: Insufficient documentation

## 2016-12-29 DIAGNOSIS — F17200 Nicotine dependence, unspecified, uncomplicated: Secondary | ICD-10-CM | POA: Insufficient documentation

## 2016-12-29 DIAGNOSIS — S0182XA Laceration with foreign body of other part of head, initial encounter: Secondary | ICD-10-CM | POA: Diagnosis not present

## 2016-12-29 DIAGNOSIS — Z79899 Other long term (current) drug therapy: Secondary | ICD-10-CM | POA: Insufficient documentation

## 2016-12-29 DIAGNOSIS — S0990XA Unspecified injury of head, initial encounter: Secondary | ICD-10-CM | POA: Diagnosis present

## 2016-12-29 DIAGNOSIS — F10929 Alcohol use, unspecified with intoxication, unspecified: Secondary | ICD-10-CM

## 2016-12-29 DIAGNOSIS — I10 Essential (primary) hypertension: Secondary | ICD-10-CM | POA: Insufficient documentation

## 2016-12-29 LAB — COMPREHENSIVE METABOLIC PANEL WITH GFR
ALT: 16 U/L — ABNORMAL LOW (ref 17–63)
AST: 43 U/L — ABNORMAL HIGH (ref 15–41)
Albumin: 3.9 g/dL (ref 3.5–5.0)
Alkaline Phosphatase: 71 U/L (ref 38–126)
Anion gap: 12 (ref 5–15)
BUN: 12 mg/dL (ref 6–20)
CO2: 24 mmol/L (ref 22–32)
Calcium: 8.6 mg/dL — ABNORMAL LOW (ref 8.9–10.3)
Chloride: 107 mmol/L (ref 101–111)
Creatinine, Ser: 1.64 mg/dL — ABNORMAL HIGH (ref 0.61–1.24)
GFR calc Af Amer: 43 mL/min — ABNORMAL LOW
GFR calc non Af Amer: 37 mL/min — ABNORMAL LOW
Glucose, Bld: 84 mg/dL (ref 65–99)
Potassium: 3.2 mmol/L — ABNORMAL LOW (ref 3.5–5.1)
Sodium: 143 mmol/L (ref 135–145)
Total Bilirubin: 0.6 mg/dL (ref 0.3–1.2)
Total Protein: 7.8 g/dL (ref 6.5–8.1)

## 2016-12-29 LAB — ETHANOL: Alcohol, Ethyl (B): 348 mg/dL

## 2016-12-29 LAB — CBC
HCT: 44.1 % (ref 40.0–52.0)
Hemoglobin: 14.5 g/dL (ref 13.0–18.0)
MCH: 30.4 pg (ref 26.0–34.0)
MCHC: 32.9 g/dL (ref 32.0–36.0)
MCV: 92.3 fL (ref 80.0–100.0)
PLATELETS: 118 10*3/uL — AB (ref 150–440)
RBC: 4.78 MIL/uL (ref 4.40–5.90)
RDW: 13.1 % (ref 11.5–14.5)
WBC: 9.2 10*3/uL (ref 3.8–10.6)

## 2016-12-29 MED ORDER — HALOPERIDOL LACTATE 5 MG/ML IJ SOLN
5.0000 mg | Freq: Once | INTRAMUSCULAR | Status: AC
  Administered 2016-12-29: 5 mg via INTRAMUSCULAR

## 2016-12-29 MED ORDER — LORAZEPAM 2 MG/ML IJ SOLN
INTRAMUSCULAR | Status: AC
  Administered 2016-12-29: 2 mg via INTRAMUSCULAR
  Filled 2016-12-29: qty 1

## 2016-12-29 MED ORDER — HALOPERIDOL LACTATE 5 MG/ML IJ SOLN
INTRAMUSCULAR | Status: AC
  Administered 2016-12-29: 5 mg via INTRAMUSCULAR
  Filled 2016-12-29: qty 1

## 2016-12-29 MED ORDER — LORAZEPAM 2 MG/ML IJ SOLN
2.0000 mg | Freq: Once | INTRAMUSCULAR | Status: AC
  Administered 2016-12-29: 2 mg via INTRAMUSCULAR

## 2016-12-29 NOTE — ED Notes (Signed)
Pt sleeping, chest rise even and unlabored, sitter present. 
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
## 2016-12-29 NOTE — ED Notes (Signed)
Pt not dressed out and no blood work done per Dr. Cyril LoosenKinner

## 2016-12-29 NOTE — ED Notes (Signed)
Pt lying in bed, talking and saying come on. Pt ready to go.

## 2016-12-29 NOTE — ED Notes (Signed)
Pt very agitated, trying to get up, yelling and trying to leave, EDP brought to bedside, medicine administered

## 2016-12-29 NOTE — ED Notes (Signed)
Pt alert, chest rise even and unlabored, sitter present, food and toilet offered.

## 2016-12-29 NOTE — ED Provider Notes (Addendum)
[facility] Emergency Department Provider Note   ____________________________________________    I have reviewed the triage vital signs and the nursing notes.   HISTORY  Chief Complaint Head Injury and Alcohol Intoxication     HPI Frank Jefferson is a 83 y.o. male who presents after a fall. Patient appears intoxicated/altered. He is unable to provide significant history   Past Medical History:  Diagnosis Date  . Hypertension     There are no active problems to display for this patient.   History reviewed. No pertinent surgical history.  Prior to Admission medications   Medication Sig Start Date End Date Taking? Authorizing Provider  amLODipine-valsartan (EXFORGE) 10-160 MG tablet Take 1 tablet by mouth daily.    Historical Provider, MD  benzonatate (TESSALON PERLES) 100 MG capsule Take 1 capsule (100 mg total) by mouth 3 (three) times daily as needed for cough. 03/27/16   Jami L Hagler, PA-C  carvedilol (COREG) 6.25 MG tablet Take 6.25 mg by mouth 2 (two) times daily with a meal.    Historical Provider, MD  Cholecalciferol (VITAMIN D3) 1000 UNITS CAPS Take by mouth.    Historical Provider, MD  ferrous sulfate 325 (65 FE) MG tablet Take by mouth.    Historical Provider, MD  loratadine (CLARITIN) 10 MG tablet Take 1 tablet (10 mg total) by mouth daily. 03/27/16   Jami L Hagler, PA-C  nystatin-triamcinolone (MYCOLOG II) cream Apply topically.    Historical Provider, MD  traMADol (ULTRAM) 50 MG tablet Take 1 tablet (50 mg total) by mouth every 6 (six) hours as needed. 08/16/16   Irean Hong, MD     Allergies Ace inhibitors; Ciprofloxacin; and Penicillins  Family History  Problem Relation Age of Onset  . Hypertension Mother   . Cancer Mother   . Cancer Brother     Social History Social History  Substance Use Topics  . Smoking status: Never Smoker  . Smokeless tobacco: Current User  . Alcohol use Yes     Comment: every other month     L5 caveat: Unable to obtain Review of Systems due to altered mental status   ____________________________________________   PHYSICAL EXAM:  VITAL SIGNS: ED Triage Vitals  Enc Vitals Group     BP 12/29/16 1707 (!) 141/94     Pulse Rate 12/29/16 1707 99     Resp 12/29/16 1707 18     Temp 12/29/16 1707 97.4 F (36.3 C)     Temp Source 12/29/16 1707 Oral     SpO2 12/29/16 1707 99 %     Weight 12/29/16 1708 145 lb (65.8 kg)     Height 12/29/16 1708 6' (1.829 m)     Head Circumference --      Peak Flow --      Pain Score --      Pain Loc --      Pain Edu? --      Excl. in GC? --     Constitutional: Alert.. No acute distress. Aggressive Eyes: Conjunctivae are normal.  Head: Swelling to the nose, no epistaxis, deep abrasion to the bridge of the nose and to the left forehead Nose: No septal hematoma No epistaxis Mouth/Throat: Mucous membranes are moist.   Neck:  Painless ROM Cardiovascular: Normal rate, regular rhythm. Grossly normal heart sounds.  Good peripheral circulation. Respiratory: Normal respiratory effort.  No retractions. Lungs CTAB. Gastrointestinal: Soft and nontender. No distention.  No CVA tenderness. Genitourinary: deferred Musculoskeletal: No lower extremity  tenderness nor edema.  Warm and well perfused Neurologic:   No focal deficits are appreciated.  Skin:  Skin is warm, dry. 2 cm jagged laceration left face, abrasion to nose. Psychiatric:Patient is aggressive and threatening to staff ____________________________________________   LABS (all labs ordered are listed, but only abnormal results are displayed)  Labs Reviewed  CBC - Abnormal; Notable for the following:       Result Value   Platelets 118 (*)    All other components within normal limits  COMPREHENSIVE METABOLIC PANEL - Abnormal; Notable for the following:    Potassium 3.2 (*)    Creatinine, Ser 1.64 (*)    Calcium 8.6 (*)    AST 43 (*)    ALT 16 (*)    GFR calc non Af Amer 37 (*)     GFR calc Af Amer 43 (*)    All other components within normal limits  ETHANOL - Abnormal; Notable for the following:    Alcohol, Ethyl (B) 348 (*)    All other components within normal limits   ____________________________________________  EKG  None ____________________________________________  RADIOLOGY  CT head and maxillofacial scans negative for acute injury ____________________________________________   PROCEDURES  Procedure(s) performed: yes  LACERATION REPAIR Performed by: Jene EveryKINNER, Alie Authorized by: Jene EveryKINNER, Rayyan Consent: Verbal consent obtained. Risks and benefits: risks, benefits and alternatives were discussed Consent given by: patient Patient identity confirmed: provided demographic data Prepped and Draped in normal sterile fashion Wound explored  Laceration Location:left face  Laceration Length: 2 cm  No Foreign Bodies seen or palpated  Irrigation method: syringe Amount of cleaning: standard  Skin closure: skin glue    Technique: dermabond, steri-strips  Patient tolerance: Patient tolerated the procedure well with no immediate complications.    Critical Care performed: No ____________________________________________   INITIAL IMPRESSION / ASSESSMENT AND PLAN / ED COURSE  Pertinent labs & imaging results that were available during my care of the patient were reviewed by me and considered in my medical decision making (see chart for details).  patient appears clinically intoxicated and with a head injury. We will obtain CT head and maxillofacial scans   ----------------------------------------- 5:58 PM on 12/29/2016 ----------------------------------------- Patient appears clinically intoxicated, he is yelling at staff, out of bed and staggering around the room. I instructed nurse to give IM Ativan and IM Haldol to protect the patient and staff as he does not have decisional capacity at this  time ____________________________________________ ----------------------------------------- 10:26 PM on 12/29/2016 -----------------------------------------  CT scans are reassuring, lab work is most significant for elevated EtOH. He is pending family to take him home  FINAL CLINICAL IMPRESSION(S) / ED DIAGNOSES  Final diagnoses:  Alcoholic intoxication without complication (HCC)  Injury of head, initial encounter      NEW MEDICATIONS STARTED DURING THIS VISIT:  New Prescriptions   No medications on file     Note:  This document was prepared using Dragon voice recognition software and may include unintentional dictation errors.    Jene Everyobert Amritpal Shropshire, MD 12/29/16 [id number]    Jene Everyobert Jerri Glauser, MD 12/29/16 [id number]

## 2016-12-29 NOTE — ED Notes (Signed)
Pt returned from CT, CT unable to be preformed due to pt movement, EDP aware

## 2016-12-29 NOTE — ED Notes (Signed)
At present pt resting in bed, eyes closed, pt lying quietly

## 2016-12-29 NOTE — ED Notes (Signed)
Patient transported to CT 

## 2016-12-29 NOTE — ED Notes (Signed)
Pt sleeping, chest rise even and unlabored, sitter present.

## 2016-12-29 NOTE — ED Notes (Signed)
Sister Gilmer Morola [phone]  Brother Felicity Pellegrinied [phone]

## 2016-12-29 NOTE — ED Provider Notes (Signed)
-----------------------------------------   11:53 PM on 12/29/2016 -----------------------------------------  Per my signout from Dr. Cyril LoosenKinner, pt is to be discharged.  D/C was selected on chart however, instructions were not printed. Pt in nad family here requesting d/c.  I have printed d/c instructions. I did not take any further care of pt.    Jeanmarie PlantJames A Grayton Lobo, MD 12/29/16 [phone]

## 2016-12-29 NOTE — ED Notes (Signed)
Sitter at bedside for safety.

## 2016-12-29 NOTE — ED Notes (Signed)
EDP at bedside  

## 2016-12-29 NOTE — ED Notes (Signed)
Pt taken to CT scan by officer and EDT

## 2016-12-30 NOTE — ED Notes (Signed)
Patient's nephew, Felicity Pellegrinied, to ED to transport patient for discharge.  Discharge instructions reviewed with family and has no further questions.

## 2017-09-20 ENCOUNTER — Other Ambulatory Visit: Payer: Self-pay

## 2017-09-20 ENCOUNTER — Emergency Department
Admission: EM | Admit: 2017-09-20 | Discharge: 2017-09-20 | Disposition: A | Discharge status: Home / Self Care | Payer: Medicare Other | Attending: Emergency Medicine | Admitting: Emergency Medicine

## 2017-09-20 DIAGNOSIS — Z79899 Other long term (current) drug therapy: Secondary | ICD-10-CM | POA: Diagnosis not present

## 2017-09-20 DIAGNOSIS — R1030 Lower abdominal pain, unspecified: Secondary | ICD-10-CM | POA: Diagnosis not present

## 2017-09-20 DIAGNOSIS — I10 Essential (primary) hypertension: Secondary | ICD-10-CM | POA: Insufficient documentation

## 2017-09-20 LAB — URINALYSIS, COMPLETE (UACMP) WITH MICROSCOPIC
BACTERIA UA: NONE SEEN
Bilirubin Urine: NEGATIVE
Glucose, UA: NEGATIVE mg/dL
Hgb urine dipstick: NEGATIVE
Ketones, ur: NEGATIVE mg/dL
Leukocytes, UA: NEGATIVE
NITRITE: NEGATIVE
PH: 6 (ref 5.0–8.0)
Protein, ur: NEGATIVE mg/dL
RBC / HPF: NONE SEEN RBC/hpf (ref 0–5)
SQUAMOUS EPITHELIAL / LPF: NONE SEEN
Specific Gravity, Urine: 1.003 — ABNORMAL LOW (ref 1.005–1.030)
WBC, UA: NONE SEEN WBC/hpf (ref 0–5)

## 2017-09-20 NOTE — ED Triage Notes (Signed)
Patient with compliant of lower abdominal pain times two days. Patient denies vomiting or urinary symptoms.

## 2017-09-20 NOTE — ED Provider Notes (Signed)
[facility] Emergency Department Provider Note  ____________________________________________   First MD Initiated Contact with Patient 09/20/17 2120     (approximate)  I have reviewed the triage vital signs and the nursing notes.   HISTORY  Chief Complaint Abdominal Pain   HPI Frank Jefferson is a 83 y.o. male with history of hypertension was presenting to the emergency department today with lower abdominal discomfort which he says "does not hurt."  He says the sensation is dull and worsens when he lays back.  He denies any dysuria.  Denies any nausea, vomiting or diarrhea.  Does admit to recent weight loss that was unintentional.  Says that the sensation is been ongoing over the past 3 days.  Patient is supposed to be taking medicine for his high blood pressure at home but refuses.  He is here with his cousin who says that the patient does indeed have pills at home and has Dr. [facility], but he has willfully noncompliant.    Past Medical History:  Diagnosis Date  . Hypertension     There are no active problems to display for this patient.   No past surgical history on file.  Prior to Admission medications   Medication Sig Start Date End Date Taking? Authorizing Provider  amLODipine-valsartan (EXFORGE) 10-160 MG tablet Take 1 tablet by mouth daily.    [provider]  benzonatate (TESSALON PERLES) 100 MG capsule Take 1 capsule (100 mg total) by mouth 3 (three) times daily as needed for cough. 03/27/16   Hagler, Jami L, PA-C  carvedilol (COREG) 6.25 MG tablet Take 6.25 mg by mouth 2 (two) times daily with a meal.    [provider]  Cholecalciferol (VITAMIN D3) 1000 UNITS CAPS Take by mouth.    [provider]  ferrous sulfate 325 (65 FE) MG tablet Take by mouth.    [provider]  loratadine (CLARITIN) 10 MG tablet Take 1 tablet (10 mg total) by mouth daily. 03/27/16   Hagler, Jami L, PA-C  nystatin-triamcinolone  (MYCOLOG II) cream Apply topically.    [provider]  traMADol (ULTRAM) 50 MG tablet Take 1 tablet (50 mg total) by mouth every 6 (six) hours as needed. 08/16/16   Irean HongSung, Jade J, MD    Allergies Ace inhibitors; Ciprofloxacin; and Penicillins  Family History  Problem Relation Age of Onset  . Hypertension Mother   . Cancer Mother   . Cancer Brother     Social History Social History   Tobacco Use  . Smoking status: Never Smoker  . Smokeless tobacco: Current User  Substance Use Topics  . Alcohol use: Yes    Comment: every other month  . Drug use: No    Review of Systems  Constitutional: No fever/chills Eyes: No visual changes. ENT: No sore throat. Cardiovascular: Denies chest pain. Respiratory: Denies shortness of breath. Gastrointestinal:No nausea, no vomiting.  No diarrhea.  No constipation. Genitourinary: Negative for dysuria. Musculoskeletal: Negative for back pain. Skin: Negative for rash. Neurological: Negative for headaches, focal weakness or numbness.   ____________________________________________   PHYSICAL EXAM:  VITAL SIGNS: ED Triage Vitals  Enc Vitals Group     BP 09/20/17 2108 (!) 209/120     Pulse Rate 09/20/17 2107 82     Resp 09/20/17 2107 18     Temp 09/20/17 2108 98.2 F (36.8 C)     Temp Source 09/20/17 2108 Oral     SpO2 09/20/17 2108 100 %  Weight 09/20/17 2109 145 lb (65.8 kg)     Height --      Head Circumference --      Peak Flow --      Pain Score 09/20/17 2106 5     Pain Loc --      Pain Edu? --      Excl. in GC? --     Constitutional: Alert and oriented. Well appearing and in no acute distress. Eyes: Conjunctivae are normal.  Head: Atraumatic. Nose: No congestion/rhinnorhea. Mouth/Throat: Mucous membranes are moist.  Neck: No stridor.   Cardiovascular: Normal rate, regular rhythm. Grossly normal heart sounds.  Respiratory: Normal respiratory effort.  No retractions. Lungs CTAB. Gastrointestinal: Soft and  nontender. No distention. No CVA tenderness. Genitourinary: Normal external genitalia without any swelling, redness or pus.  No lesions.  Bilateral testes without any tenderness.  No hernia masses palpated to the bilateral scrotum as well as the bilateral inguinal region. Musculoskeletal: No lower extremity tenderness nor edema.  No joint effusions. Neurologic:  Normal speech and language. No gross focal neurologic deficits are appreciated. Skin:  Skin is warm, dry and intact. No rash noted. Psychiatric: Mood and affect are normal. Speech and behavior are normal.  ____________________________________________   LABS (all labs ordered are listed, but only abnormal results are displayed)  Labs Reviewed  URINALYSIS, COMPLETE (UACMP) WITH MICROSCOPIC - Abnormal; Notable for the following components:      Result Value   Color, Urine STRAW (*)    APPearance CLEAR (*)    Specific Gravity, Urine 1.003 (*)    All other components within normal limits   ____________________________________________  EKG   ____________________________________________  RADIOLOGY   ____________________________________________   PROCEDURES  Procedure(s) performed:   Procedures  Critical Care performed:   ____________________________________________   INITIAL IMPRESSION / ASSESSMENT AND PLAN / ED COURSE  Pertinent labs & imaging results that were available during my care of the patient were reviewed by me and considered in my medical decision making (see chart for details).  Differential diagnosis includes, but is not limited to, acute appendicitis, renal colic, testicular torsion, urinary tract infection/pyelonephritis, prostatitis,  epididymitis, diverticulitis, small bowel obstruction or ileus, colitis, abdominal aortic aneurysm, gastroenteritis, hernia, etc.  As part of my medical decision making, I reviewed the following data within the electronic MEDICAL RECORD NUMBER Notes from prior ED  visits  Discussed possible lab work and CAT scan with the patient and his cousin.  However, the patient would rather follow up with [facility].  His cousin says that they will be able to go tomorrow.  I discussed with them that my biggest concern was that the patient could have a answers mass that is positional and this could be the reason for his pain with movement.  He has had a reassuring urinalysis.  The patient is ambulatory throughout the room without issue.  Patient also with elevated blood pressure but without any chest pain, shortness of breath or headache.  Patient says that he does not want to take his medications and is aware of his high blood pressure.  Patient will be discharged at this time.  Patient as well as his cousin understanding of the need for urgent follow-up and willing to comply.       ____________________________________________   FINAL CLINICAL IMPRESSION(S) / ED DIAGNOSES  Lower abdominal pain.    NEW MEDICATIONS STARTED DURING THIS VISIT:  This SmartLink is deprecated. Use AVSMEDLIST instead to display the medication list for a  patient.   Note:  This document was prepared using Dragon voice recognition software and may include unintentional dictation errors.     Myrna Blazer, MD 09/20/17 2150

## 2017-10-12 ENCOUNTER — Ambulatory Visit: Payer: Medicare Other | Admitting: Podiatry

## 2017-11-21 DIAGNOSIS — R079 Chest pain, unspecified: Secondary | ICD-10-CM | POA: Insufficient documentation

## 2017-11-21 DIAGNOSIS — F10939 Alcohol use, unspecified with withdrawal, unspecified: Secondary | ICD-10-CM | POA: Insufficient documentation

## 2017-11-29 DIAGNOSIS — F04 Amnestic disorder due to known physiological condition: Secondary | ICD-10-CM | POA: Insufficient documentation

## 2017-11-29 DIAGNOSIS — N189 Chronic kidney disease, unspecified: Secondary | ICD-10-CM | POA: Insufficient documentation

## 2017-11-29 DIAGNOSIS — R350 Frequency of micturition: Secondary | ICD-10-CM | POA: Insufficient documentation

## 2017-11-29 DIAGNOSIS — B029 Zoster without complications: Secondary | ICD-10-CM | POA: Insufficient documentation

## 2017-12-19 ENCOUNTER — Other Ambulatory Visit: Payer: Self-pay

## 2018-01-11 ENCOUNTER — Other Ambulatory Visit: Payer: Self-pay

## 2018-01-11 ENCOUNTER — Encounter: Payer: Self-pay | Admitting: *Deleted

## 2018-01-15 NOTE — Discharge Instructions (Signed)

## 2018-01-17 ENCOUNTER — Ambulatory Visit: Payer: Medicare Other | Admitting: Anesthesiology

## 2018-01-17 ENCOUNTER — Encounter
Admission: RE | Disposition: A | Discharge status: Home / Self Care | Payer: Self-pay | Source: Ambulatory Visit | Attending: Ophthalmology

## 2018-01-17 ENCOUNTER — Ambulatory Visit
Admission: RE | Admit: 2018-01-17 | Discharge: 2018-01-17 | Disposition: A | Discharge status: Home / Self Care | Payer: Medicare Other | Source: Ambulatory Visit | Attending: Ophthalmology | Admitting: Ophthalmology

## 2018-01-17 DIAGNOSIS — I10 Essential (primary) hypertension: Secondary | ICD-10-CM | POA: Insufficient documentation

## 2018-01-17 DIAGNOSIS — H2511 Age-related nuclear cataract, right eye: Secondary | ICD-10-CM | POA: Insufficient documentation

## 2018-01-17 DIAGNOSIS — Z720 Tobacco use: Secondary | ICD-10-CM | POA: Insufficient documentation

## 2018-01-17 DIAGNOSIS — Z79899 Other long term (current) drug therapy: Secondary | ICD-10-CM | POA: Diagnosis not present

## 2018-01-17 HISTORY — PX: CATARACT EXTRACTION W/PHACO: SHX586

## 2018-01-17 HISTORY — DX: Alcohol abuse, uncomplicated: F10.10

## 2018-01-17 SURGERY — PHACOEMULSIFICATION, CATARACT, WITH IOL INSERTION
Anesthesia: Monitor Anesthesia Care | Site: Eye | Laterality: Right | Wound class: Clean

## 2018-01-17 MED ORDER — BRIMONIDINE TARTRATE-TIMOLOL 0.2-0.5 % OP SOLN
OPHTHALMIC | Status: DC | PRN
  Administered 2018-01-17: 1 [drp] via OPHTHALMIC

## 2018-01-17 MED ORDER — NA HYALUR & NA CHOND-NA HYALUR 0.4-0.35 ML IO KIT
PACK | INTRAOCULAR | Status: DC | PRN
  Administered 2018-01-17: 1 mL via INTRAOCULAR

## 2018-01-17 MED ORDER — MIDAZOLAM HCL 2 MG/2ML IJ SOLN
INTRAMUSCULAR | Status: DC | PRN
  Administered 2018-01-17: 1 mg via INTRAVENOUS

## 2018-01-17 MED ORDER — LIDOCAINE HCL (PF) 2 % IJ SOLN
INTRAOCULAR | Status: DC | PRN
  Administered 2018-01-17: 1 mL

## 2018-01-17 MED ORDER — FENTANYL CITRATE (PF) 100 MCG/2ML IJ SOLN
INTRAMUSCULAR | Status: DC | PRN
  Administered 2018-01-17: 50 ug via INTRAVENOUS

## 2018-01-17 MED ORDER — ACETAMINOPHEN 325 MG PO TABS: 650.0000 mg | ORAL_TABLET | Freq: Once | ORAL | Status: DC | PRN

## 2018-01-17 MED ORDER — POLYMYXIN B-TRIMETHOPRIM 10000-0.1 UNIT/ML-% OP SOLN
OPHTHALMIC | Status: DC | PRN
  Administered 2018-01-17: 1 [drp]

## 2018-01-17 MED ORDER — ARMC OPHTHALMIC DILATING DROPS
1.0000 "application " | OPHTHALMIC | Status: DC | PRN
  Administered 2018-01-17 (×3): 1 via OPHTHALMIC

## 2018-01-17 MED ORDER — EPINEPHRINE PF 1 MG/ML IJ SOLN
INTRAOCULAR | Status: DC | PRN
  Administered 2018-01-17: 68 mL via OPHTHALMIC

## 2018-01-17 MED ORDER — ONDANSETRON HCL 4 MG/2ML IJ SOLN: 4.0000 mg | Freq: Once | INTRAMUSCULAR | Status: DC | PRN

## 2018-01-17 MED ORDER — POLYMYXIN B-TRIMETHOPRIM 10000-0.1 UNIT/ML-% OP SOLN
1.0000 [drp] | OPHTHALMIC | Status: AC
  Administered 2018-01-17 (×3): 1 [drp] via OPHTHALMIC

## 2018-01-17 MED ORDER — LACTATED RINGERS IV SOLN: INTRAVENOUS | Status: DC

## 2018-01-17 MED ORDER — ACETAMINOPHEN 160 MG/5ML PO SOLN: 325.0000 mg | ORAL | Status: DC | PRN

## 2018-01-17 SURGICAL SUPPLY — 25 items
CANNULA ANT/CHMB 27GA (MISCELLANEOUS) ×3 IMPLANT
CARTRIDGE ABBOTT (MISCELLANEOUS) IMPLANT
GLOVE SURG LX 7.5 STRW (GLOVE) ×4
GLOVE SURG LX STRL 7.5 STRW (GLOVE) ×2 IMPLANT
GLOVE SURG TRIUMPH 8.0 PF LTX (GLOVE) ×3 IMPLANT
GOWN STRL REUS W/ TWL LRG LVL3 (GOWN DISPOSABLE) ×2 IMPLANT
GOWN STRL REUS W/TWL LRG LVL3 (GOWN DISPOSABLE) ×4
LENS IOL TECNIS ITEC 21.5 (Intraocular Lens) ×3 IMPLANT
MARKER SKIN DUAL TIP RULER LAB (MISCELLANEOUS) ×3 IMPLANT
NDL RETROBULBAR .5 NSTRL (NEEDLE) IMPLANT
NEEDLE FILTER BLUNT 18X 1/2SAF (NEEDLE) ×2
NEEDLE FILTER BLUNT 18X1 1/2 (NEEDLE) ×1 IMPLANT
PACK CATARACT BRASINGTON (MISCELLANEOUS) ×3 IMPLANT
PACK EYE AFTER SURG (MISCELLANEOUS) ×3 IMPLANT
PACK OPTHALMIC (MISCELLANEOUS) ×3 IMPLANT
RING MALYGIN 7.0 (MISCELLANEOUS) ×3 IMPLANT
SUT ETHILON 10-0 CS-B-6CS-B-6 (SUTURE)
SUT VICRYL  9 0 (SUTURE)
SUT VICRYL 9 0 (SUTURE) IMPLANT
SUTURE EHLN 10-0 CS-B-6CS-B-6 (SUTURE) IMPLANT
SYR 3ML LL SCALE MARK (SYRINGE) ×3 IMPLANT
SYR 5ML LL (SYRINGE) ×3 IMPLANT
SYR TB 1ML LUER SLIP (SYRINGE) ×3 IMPLANT
WATER STERILE IRR 500ML POUR (IV SOLUTION) ×3 IMPLANT
WIPE NON LINTING 3.25X3.25 (MISCELLANEOUS) ×3 IMPLANT

## 2018-01-17 NOTE — Anesthesia Preprocedure Evaluation (Signed)
Anesthesia Evaluation  Patient identified by MRN, date of birth, ID band Patient awake    Reviewed: Allergy & Precautions, NPO status , Patient's Chart, lab work & pertinent test results  History of Anesthesia Complications Negative for: history of anesthetic complications  Airway Mallampati: III  TM Distance: >3 FB Neck ROM: Full    Dental   Many missing teeth; none loose or chipped per pt:   Pulmonary neg pulmonary ROS,    Pulmonary exam normal breath sounds clear to auscultation       Cardiovascular Exercise Tolerance: Good hypertension, Normal cardiovascular exam Rhythm:Regular Rate:Normal     Neuro/Psych negative neurological ROS     GI/Hepatic negative GI ROS, (+)     substance abuse  alcohol use,   Endo/Other  negative endocrine ROS  Renal/GU negative Renal ROS     Musculoskeletal   Abdominal   Peds  Hematology negative hematology ROS (+)   Anesthesia Other Findings   Reproductive/Obstetrics                             Anesthesia Physical Anesthesia Plan  ASA: II  Anesthesia Plan: MAC   Post-op Pain Management:    Induction: Intravenous  PONV Risk Score and Plan: 1 and TIVA and Midazolam  Airway Management Planned: Natural Airway  Additional Equipment:   Intra-op Plan:   Post-operative Plan:   Informed Consent: I have reviewed the patients History and Physical, chart, labs and discussed the procedure including the risks, benefits and alternatives for the proposed anesthesia with the patient or authorized representative who has indicated his/her understanding and acceptance.     Plan Discussed with: CRNA  Anesthesia Plan Comments:         Anesthesia Quick Evaluation

## 2018-01-17 NOTE — Anesthesia Postprocedure Evaluation (Signed)
Anesthesia Post Note  Patient: Frank Jefferson  Procedure(s) Performed: CATARACT EXTRACTION PHACO AND INTRAOCULAR LENS PLACEMENT (IOC) COMPLICATED RIGHT (Right Eye)  Patient location during evaluation: PACU Anesthesia Type: MAC Level of consciousness: awake and alert, oriented and patient cooperative Pain management: pain level controlled Vital Signs Assessment: post-procedure vital signs reviewed and stable Respiratory status: spontaneous breathing, nonlabored ventilation and respiratory function stable Cardiovascular status: blood pressure returned to baseline and stable Postop Assessment: adequate PO intake Anesthetic complications: no    Darrin Nipper

## 2018-01-17 NOTE — H&P (Signed)
The History and Physical notes are on paper, have been signed, and are to be scanned. The patient remains stable and unchanged from the H&P.   Previous H&P reviewed, patient examined, and there are no changes.  Frank Jefferson 01/17/2018 10:33 AM

## 2018-01-17 NOTE — Transfer of Care (Signed)
Immediate Anesthesia Transfer of Care Note  Patient: Frank Jefferson  Procedure(s) Performed: CATARACT EXTRACTION PHACO AND INTRAOCULAR LENS PLACEMENT (IOC) COMPLICATED RIGHT (Right Eye)  Patient Location: PACU  Anesthesia Type: MAC  Level of Consciousness: awake, alert  and patient cooperative  Airway and Oxygen Therapy: Patient Spontanous Breathing and Patient connected to supplemental oxygen  Post-op Assessment: Post-op Vital signs reviewed, Patient's Cardiovascular Status Stable, Respiratory Function Stable, Patent Airway and No signs of Nausea or vomiting  Post-op Vital Signs: Reviewed and stable  Complications: No apparent anesthesia complications

## 2018-01-17 NOTE — Op Note (Signed)
LOCATION:  [facility]   PREOPERATIVE DIAGNOSIS:    Nuclear sclerotic cataract right eye. H25.11   POSTOPERATIVE DIAGNOSIS:  Nuclear sclerotic cataract right eye.     PROCEDURE:  Phacoemusification with posterior chamber intraocular lens placement of the right eye   LENS:   Implant Name Type Inv. Item Serial No. Manufacturer Lot No. LRB No. Used  LENS IOL DIOP 21.5 - [id number] Intraocular Lens LENS IOL DIOP 21.5 [id number] AMO  Right 1        ULTRASOUND TIME: 14 % of 1 minutes, 7 seconds.  CDE 9.3   SURGEON:  Deirdre Evenerhadwick R. Woods Gangemi, MD   ANESTHESIA:  Topical with tetracaine drops and 2% Xylocaine jelly, augmented with 1% preservative-free intracameral lidocaine.    COMPLICATIONS:  None.   DESCRIPTION OF PROCEDURE:  The patient was identified in the holding room and transported to the operating room and placed in the supine position under the operating microscope.  The right eye was identified as the operative eye and it was prepped and draped in the usual sterile ophthalmic fashion.   A 1 millimeter clear-corneal paracentesis was made at the 12:00 position.  0.5 ml of preservative-free 1% lidocaine was injected into the anterior chamber. The anterior chamber was filled with Viscoat viscoelastic.  A 2.4 millimeter keratome was used to make a near-clear corneal incision at the 9:00 position.  A curvilinear capsulorrhexis was made with a cystotome and capsulorrhexis forceps.  Balanced salt solution was used to hydrodissect and hydrodelineate the nucleus.   Phacoemulsification was then used in stop and chop fashion to remove the lens nucleus and epinucleus.  The remaining cortex was then removed using the irrigation and aspiration handpiece. Provisc was then placed into the capsular bag to distend it for lens placement.  A lens was then injected into the capsular bag.  The remaining viscoelastic was aspirated.   Wounds were hydrated with balanced salt solution.  The anterior  chamber was inflated to a physiologic pressure with balanced salt solution.  No wound leaks were noted.   Timolol and Brimonidine drops were applied to the eye.  The patient was taken to the recovery room in stable condition without complications of anesthesia or surgery.   Frank Jefferson 01/17/2018, 11:25 AM

## 2018-01-17 NOTE — Anesthesia Procedure Notes (Signed)
Procedure Name: MAC Date/Time: 01/17/2018 11:04 AM Performed by: Cameron Ali, CRNA Pre-anesthesia Checklist: Patient identified, Emergency Drugs available, Suction available, Timeout performed and Patient being monitored Patient Re-evaluated:Patient Re-evaluated prior to induction Oxygen Delivery Method: Nasal cannula Placement Confirmation: positive ETCO2

## 2018-02-07 NOTE — Discharge Instructions (Signed)

## 2018-02-08 ENCOUNTER — Other Ambulatory Visit: Payer: Self-pay

## 2018-02-08 ENCOUNTER — Encounter: Payer: Self-pay | Admitting: *Deleted

## 2018-02-08 ENCOUNTER — Encounter: Payer: Self-pay | Admitting: Anesthesiology

## 2018-02-09 ENCOUNTER — Emergency Department
Admission: EM | Admit: 2018-02-09 | Discharge: 2018-02-09 | Disposition: A | Discharge status: Home / Self Care | Payer: Medicare Other | Attending: Emergency Medicine | Admitting: Emergency Medicine

## 2018-02-09 ENCOUNTER — Encounter: Payer: Self-pay | Admitting: Emergency Medicine

## 2018-02-09 DIAGNOSIS — Z79899 Other long term (current) drug therapy: Secondary | ICD-10-CM | POA: Diagnosis not present

## 2018-02-09 DIAGNOSIS — I10 Essential (primary) hypertension: Secondary | ICD-10-CM | POA: Diagnosis not present

## 2018-02-09 DIAGNOSIS — R350 Frequency of micturition: Secondary | ICD-10-CM | POA: Diagnosis not present

## 2018-02-09 DIAGNOSIS — F17228 Nicotine dependence, chewing tobacco, with other nicotine-induced disorders: Secondary | ICD-10-CM | POA: Insufficient documentation

## 2018-02-09 LAB — URINALYSIS, COMPLETE (UACMP) WITH MICROSCOPIC
BILIRUBIN URINE: NEGATIVE
Bacteria, UA: NONE SEEN
GLUCOSE, UA: NEGATIVE mg/dL
HGB URINE DIPSTICK: NEGATIVE
KETONES UR: NEGATIVE mg/dL
LEUKOCYTES UA: NEGATIVE
Nitrite: NEGATIVE
PROTEIN: NEGATIVE mg/dL
RBC / HPF: NONE SEEN RBC/hpf (ref 0–5)
Specific Gravity, Urine: 1.003 — ABNORMAL LOW (ref 1.005–1.030)
Squamous Epithelial / LPF: NONE SEEN
pH: 6 (ref 5.0–8.0)

## 2018-02-09 LAB — CBC
HEMATOCRIT: 45.6 % (ref 40.0–52.0)
Hemoglobin: 14.9 g/dL (ref 13.0–18.0)
MCH: 28.7 pg (ref 26.0–34.0)
MCHC: 32.6 g/dL (ref 32.0–36.0)
MCV: 88 fL (ref 80.0–100.0)
PLATELETS: 167 10*3/uL (ref 150–440)
RBC: 5.18 MIL/uL (ref 4.40–5.90)
RDW: 16.4 % — AB (ref 11.5–14.5)
WBC: 6.3 10*3/uL (ref 3.8–10.6)

## 2018-02-09 LAB — COMPREHENSIVE METABOLIC PANEL
ALBUMIN: 3.6 g/dL (ref 3.5–5.0)
ALT: 7 U/L — ABNORMAL LOW (ref 17–63)
AST: 21 U/L (ref 15–41)
Alkaline Phosphatase: 66 U/L (ref 38–126)
Anion gap: 9 (ref 5–15)
BILIRUBIN TOTAL: 0.5 mg/dL (ref 0.3–1.2)
BUN: 11 mg/dL (ref 6–20)
CHLORIDE: 111 mmol/L (ref 101–111)
CO2: 24 mmol/L (ref 22–32)
Calcium: 8.4 mg/dL — ABNORMAL LOW (ref 8.9–10.3)
Creatinine, Ser: 1.38 mg/dL — ABNORMAL HIGH (ref 0.61–1.24)
GFR calc Af Amer: 53 mL/min — ABNORMAL LOW (ref >60)
GFR calc non Af Amer: 45 mL/min — ABNORMAL LOW (ref >60)
GLUCOSE: 90 mg/dL (ref 65–99)
POTASSIUM: 2.9 mmol/L — AB (ref 3.5–5.1)
SODIUM: 144 mmol/L (ref 135–145)
TOTAL PROTEIN: 7.5 g/dL (ref 6.5–8.1)

## 2018-02-09 MED ORDER — TAMSULOSIN HCL 0.4 MG PO CAPS: 0.4000 mg | ORAL_CAPSULE | Freq: Every day | ORAL | 1 refills | Status: DC

## 2018-02-09 NOTE — Discharge Instructions (Addendum)
Please take your medication as prescribed.  Call the number provided for urology to arrange a follow-up appointment as soon as possible.

## 2018-02-09 NOTE — ED Triage Notes (Signed)
Patient presents to the ED with urinary frequency x 2 days.  Patient's sister-in-law is now caring for patient and states his sister used to care for him but is now in hospice care so she is unsure of patient's medical history.  Patient states, "this has happened before, depending on what I ate."  Patient is in no obvious distress at this time.  Denies pain with urination.

## 2018-02-09 NOTE — ED Notes (Signed)
Pt reports urinary frequency for "a while" family at bedside states they want him to have his prostate checked as well.

## 2018-02-09 NOTE — ED Provider Notes (Signed)
[facility] Emergency Department Provider Note  [facility]  I have reviewed the triage vital signs and the nursing notes.   HISTORY  Chief Complaint Urinary Frequency    HPI Frank Jefferson is a 84 y.o. male with a past medical history of alcohol use, hypertension, presents to the emergency department for frequent urination.  According to the patient and sister-in-law the patient has been urinating very frequently.  Patient states when he urinates only a little bit comes out so he has to urinate often.  Denies any dysuria or hematuria.  Denies abdominal pain, nausea, vomiting, diarrhea.  Patient states a family history of prostate cancer and enlarged prostate but denies any personal history.  No history of diabetes.   Past Medical History:  Diagnosis Date  . Alcohol abuse   . Hypertension     There are no active problems to display for this patient.   Past Surgical History:  Procedure Laterality Date  . CATARACT EXTRACTION W/PHACO Right 01/17/2018   Procedure: CATARACT EXTRACTION PHACO AND INTRAOCULAR LENS PLACEMENT (IOC) COMPLICATED RIGHT;  Surgeon: Lockie MolaBrasington, Chadwick, MD;  [facility];  Service: Ophthalmology;  Laterality: Right;  . FOOT SURGERY Right    metal plate    Prior to Admission medications   Medication Sig Start Date End Date Taking? Authorizing Provider  amLODipine (NORVASC) 10 MG tablet Take 10 mg by mouth daily.    [provider]  amLODipine-valsartan (EXFORGE) 10-160 MG tablet Take 1 tablet by mouth daily.    [provider]  carvedilol (COREG) 6.25 MG tablet Take 6.25 mg by mouth 2 (two) times daily with a meal.    [provider]  Cholecalciferol (VITAMIN D3) 1000 UNITS CAPS Take by mouth.    [provider]  ferrous sulfate 325 (65 FE) MG tablet Take by mouth.    [provider]  loratadine (CLARITIN) 10 MG tablet Take 1 tablet (10 mg total) by mouth  daily. Patient not taking: Reported on 01/11/2018 03/27/16   Hagler, Jami L, PA-C  nystatin-triamcinolone (MYCOLOG II) cream Apply topically.    [provider]  traMADol (ULTRAM) 50 MG tablet Take 1 tablet (50 mg total) by mouth every 6 (six) hours as needed. Patient not taking: Reported on 01/11/2018 08/16/16   Irean HongSung, Jade J, MD    Allergies  Allergen Reactions  . Ace Inhibitors Anaphylaxis  . Ciprofloxacin Anaphylaxis  . Penicillins Anaphylaxis    Family History  Problem Relation Age of Onset  . Hypertension Mother   . Cancer Mother   . Cancer Brother     Social History Social History   Tobacco Use  . Smoking status: Never Smoker  . Smokeless tobacco: Current User    Types: Snuff  Substance Use Topics  . Alcohol use: Yes    Comment: "1 40oz lasts all week"  . Drug use: No    Review of Systems Constitutional: Negative for fever. Eyes: Negative for visual complaints ENT: Negative for recent illness/congestion Cardiovascular: Negative for chest pain. Respiratory: Negative for shortness of breath. Gastrointestinal: Negative for abdominal pain, vomiting and diarrhea. Genitourinary: Urinary frequency, but denies hematuria or dysuria. Musculoskeletal: Negative for musculoskeletal complaints Skin: Negative for skin complaints  Neurological: Negative for headache All other ROS negative  ____________________________________________   PHYSICAL EXAM:  VITAL SIGNS: ED Triage Vitals  Enc Vitals Group     BP 02/09/18 1443 (!) 141/104     Pulse Rate 02/09/18 1443 70  Resp 02/09/18 1443 16     Temp 02/09/18 1443 (!) 97.5 F (36.4 C)     Temp Source 02/09/18 1443 Oral     SpO2 02/09/18 1443 95 %     Weight 02/09/18 1444 132 lb 11.2 oz (60.2 kg)     Height 02/09/18 1444 5' (1.524 m)     Head Circumference --      Peak Flow --      Pain Score 02/09/18 1443 0     Pain Loc --      Pain Edu? --      Excl. in GC? --    Constitutional: Alert and oriented. Well  appearing and in no distress. Eyes: Normal exam ENT   Head: Normocephalic and atraumatic.   Mouth/Throat: Mucous membranes are moist. Cardiovascular: Normal rate, regular rhythm. No murmur Respiratory: Normal respiratory effort without tachypnea nor retractions. Breath sounds are clear  Gastrointestinal: Soft and nontender. No distention.  Musculoskeletal: Nontender with normal range of motion in all extremities.  Neurologic:  Normal speech and language. No gross focal neurologic deficits  Skin:  Skin is warm, dry and intact.  Psychiatric: Mood and affect are normal.  ____________________________________________   INITIAL IMPRESSION / ASSESSMENT AND PLAN / ED COURSE  Pertinent labs & imaging results that were available during my care of the patient were reviewed by me and considered in my medical decision making (see chart for details).  Patient presents to the emergency department for urinary frequency.  Differential would include urinary tract infection, BPH, diabetes.  We will check labs, and continue to closely monitor.  Patient's urinalysis is normal, no sign of infection, no glucose in the urine.  His labs are largely within normal limits.  Glucose is normal.  Highly suspect BPH.  We will discharge with Flomax and urology follow-up.  Patient agreeable to this plan of care.  ____________________________________________   FINAL CLINICAL IMPRESSION(S) / ED DIAGNOSES  Urinary frequency    Minna Antis, MD 02/09/18 1645

## 2018-02-14 ENCOUNTER — Ambulatory Visit
Admission: RE | Admit: 2018-02-14 | Discharge: 2018-02-14 | Disposition: A | Discharge status: Home / Self Care | Payer: Medicare Other | Source: Ambulatory Visit | Attending: Ophthalmology | Admitting: Ophthalmology

## 2018-02-14 ENCOUNTER — Encounter
Admission: RE | Disposition: A | Discharge status: Home / Self Care | Payer: Self-pay | Source: Ambulatory Visit | Attending: Ophthalmology

## 2018-02-14 DIAGNOSIS — H269 Unspecified cataract: Secondary | ICD-10-CM | POA: Insufficient documentation

## 2018-02-14 DIAGNOSIS — Z5309 Procedure and treatment not carried out because of other contraindication: Secondary | ICD-10-CM | POA: Diagnosis not present

## 2018-02-14 SURGERY — PHACOEMULSIFICATION, CATARACT, WITH IOL INSERTION
Anesthesia: Topical | Laterality: Left

## 2018-02-14 SURGICAL SUPPLY — 24 items
CANNULA ANT/CHMB 27GA (MISCELLANEOUS) ×3 IMPLANT
CARTRIDGE ABBOTT (MISCELLANEOUS) IMPLANT
GLOVE SURG LX 7.5 STRW (GLOVE) ×2
GLOVE SURG LX STRL 7.5 STRW (GLOVE) ×1 IMPLANT
GLOVE SURG TRIUMPH 8.0 PF LTX (GLOVE) ×3 IMPLANT
GOWN STRL REUS W/ TWL LRG LVL3 (GOWN DISPOSABLE) ×2 IMPLANT
GOWN STRL REUS W/TWL LRG LVL3 (GOWN DISPOSABLE) ×4
MARKER SKIN DUAL TIP RULER LAB (MISCELLANEOUS) ×3 IMPLANT
NDL RETROBULBAR .5 NSTRL (NEEDLE) IMPLANT
NEEDLE FILTER BLUNT 18X 1/2SAF (NEEDLE) ×2
NEEDLE FILTER BLUNT 18X1 1/2 (NEEDLE) ×1 IMPLANT
PACK CATARACT BRASINGTON (MISCELLANEOUS) ×3 IMPLANT
PACK EYE AFTER SURG (MISCELLANEOUS) ×3 IMPLANT
PACK OPTHALMIC (MISCELLANEOUS) ×3 IMPLANT
RING MALYGIN 7.0 (MISCELLANEOUS) IMPLANT
SUT ETHILON 10-0 CS-B-6CS-B-6 (SUTURE)
SUT VICRYL  9 0 (SUTURE)
SUT VICRYL 9 0 (SUTURE) IMPLANT
SUTURE EHLN 10-0 CS-B-6CS-B-6 (SUTURE) IMPLANT
SYR 3ML LL SCALE MARK (SYRINGE) ×3 IMPLANT
SYR 5ML LL (SYRINGE) ×3 IMPLANT
SYR TB 1ML LUER SLIP (SYRINGE) ×3 IMPLANT
WATER STERILE IRR 500ML POUR (IV SOLUTION) ×3 IMPLANT
WIPE NON LINTING 3.25X3.25 (MISCELLANEOUS) ×3 IMPLANT

## 2018-02-14 NOTE — Progress Notes (Signed)
Patient canceled due to patient having breakfast

## 2018-02-26 ENCOUNTER — Encounter: Payer: Self-pay | Admitting: Urology

## 2018-02-26 ENCOUNTER — Ambulatory Visit (INDEPENDENT_AMBULATORY_CARE_PROVIDER_SITE_OTHER): Payer: Medicare Other | Admitting: Urology

## 2018-02-26 VITALS — BP 134/92 | HR 105 | Ht 64.0 in | Wt 128.0 lb

## 2018-02-26 DIAGNOSIS — R350 Frequency of micturition: Secondary | ICD-10-CM | POA: Diagnosis not present

## 2018-02-26 LAB — URINALYSIS, COMPLETE
Bilirubin, UA: NEGATIVE
Glucose, UA: NEGATIVE
Leukocytes, UA: NEGATIVE
Nitrite, UA: NEGATIVE
PH UA: 5 (ref 5.0–7.5)
RBC, UA: NEGATIVE
Specific Gravity, UA: 1.025 (ref 1.005–1.030)
UUROB: 0.2 mg/dL (ref 0.2–1.0)

## 2018-02-26 LAB — MICROSCOPIC EXAMINATION: RBC, UA: NONE SEEN /hpf (ref 0–2)

## 2018-02-26 LAB — BLADDER SCAN AMB NON-IMAGING

## 2018-02-26 MED ORDER — TAMSULOSIN HCL 0.4 MG PO CAPS: 0.4000 mg | ORAL_CAPSULE | Freq: Every day | ORAL | 11 refills | Status: DC

## 2018-02-26 NOTE — Progress Notes (Signed)
02/26/2018 3:05 PM   Catarina Hartshorn 06-23-1931 [id number]  Referring provider: Hyman Hopes, MD [address] [place], [place] [address]  Chief Complaint  Patient presents with  . Urinary Frequency    New Patient    HPI: Patient was referred for frequency.  Some of the details were difficult to ascertain.  He voids every 1 or 2 hours and gets up once at night with a good flow.  He went to the emergency room and they put him on Flomax and he said it is helping but he was nonspecific.  The emergency room noted frequency and he has a past history of alcohol use.  I did not see any urine culture  He denies a history of kidney stones previous GU surgery and bladder infections.  He does not note blood in the urine  Modifying factors: There are no other modifying factors  Associated signs and symptoms: There are no other associated signs and symptoms Aggravating and relieving factors: There are no other aggravating or relieving factors Severity: Moderate Duration: Persistent   PMH: Past Medical History:  Diagnosis Date  . Alcohol abuse   . Hypertension     Surgical History: Past Surgical History:  Procedure Laterality Date  . CATARACT EXTRACTION W/PHACO Right 01/17/2018   Procedure: CATARACT EXTRACTION PHACO AND INTRAOCULAR LENS PLACEMENT (IOC) COMPLICATED RIGHT;  Surgeon: Lockie Mola, MD;  Location: [facility];  Service: Ophthalmology;  Laterality: Right;  . FOOT SURGERY Right    metal plate    Home Medications:  Allergies as of 02/26/2018      Reactions   Ace Inhibitors Anaphylaxis   Ciprofloxacin Anaphylaxis   Penicillins Anaphylaxis      Medication List        Accurate as of 02/26/18  3:05 PM. Always use your most recent med list.          amLODipine 10 MG tablet Commonly known as:  NORVASC Take 10 mg by mouth daily.   amLODipine-valsartan 10-160 MG tablet Commonly known as:  EXFORGE Take 1 tablet by mouth daily.     carvedilol 6.25 MG tablet Commonly known as:  COREG Take 6.25 mg by mouth 2 (two) times daily with a meal.   ferrous sulfate 325 (65 FE) MG tablet Take by mouth.   loratadine 10 MG tablet Commonly known as:  CLARITIN Take 1 tablet (10 mg total) by mouth daily.   nystatin-triamcinolone cream Commonly known as:  MYCOLOG II Apply topically.   tamsulosin 0.4 MG Caps capsule Commonly known as:  FLOMAX Take 1 capsule (0.4 mg total) by mouth daily.   traMADol 50 MG tablet Commonly known as:  ULTRAM Take 1 tablet (50 mg total) by mouth every 6 (six) hours as needed.   Vitamin D3 1000 units Caps Take by mouth.       Allergies:  Allergies  Allergen Reactions  . Ace Inhibitors Anaphylaxis  . Ciprofloxacin Anaphylaxis  . Penicillins Anaphylaxis    Family History: Family History  Problem Relation Age of Onset  . Hypertension Mother   . Cancer Mother   . Cancer Brother     Social History:  reports that he has never smoked. His smokeless tobacco use includes snuff. He reports that he drinks alcohol. He reports that he does not use drugs.  ROS: UROLOGY Frequent Urination?: Yes Hard to postpone urination?: No Burning/pain with urination?: No Get up at night to urinate?: Yes Leakage of urine?: Yes Urine stream starts and stops?:  Yes Trouble starting stream?: Yes Do you have to strain to urinate?: No Blood in urine?: No Urinary tract infection?: No Sexually transmitted disease?: No Injury to kidneys or bladder?: No Painful intercourse?: No Weak stream?: Yes Erection problems?: No Penile pain?: No  Gastrointestinal Nausea?: No Vomiting?: No Indigestion/heartburn?: No Diarrhea?: No Constipation?: No  Constitutional Fever: No Night sweats?: No Weight loss?: No Fatigue?: No  Skin Skin rash/lesions?: No Itching?: No  Eyes Blurred vision?: No Double vision?: No  Ears/Nose/Throat Sore throat?: No Sinus problems?: No  Hematologic/Lymphatic Swollen  glands?: No Easy bruising?: No  Cardiovascular Leg swelling?: No Chest pain?: No  Respiratory Cough?: No Shortness of breath?: No  Endocrine Excessive thirst?: No  Musculoskeletal Back pain?: No Joint pain?: No  Neurological Headaches?: No Dizziness?: No  Psychologic Depression?: No Anxiety?: No  Physical Exam: BP (!) 134/92   Pulse (!) 105   Ht 5\' 4"  (1.626 m)   Wt 128 lb (58.1 kg)   BMI 21.97 kg/m   Constitutional:  Alert and oriented, No acute distress. HEENT: [place] AT, moist mucus membranes.  Trachea midline, no masses. Cardiovascular: No clubbing, cyanosis, or edema. Respiratory: Normal respiratory effort, no increased work of breathing. GI: Abdomen is soft, nontender, nondistended, no abdominal masses GU: No CVA tenderness.  There nodule near the apex in the midline of his prostate. Skin: No rashes, bruises or suspicious lesions. Lymph: No cervical or inguinal adenopathy. Neurologic: Grossly intact, no focal deficits, moving all 4 extremities. Psychiatric: Normal mood and affect.  Laboratory Data: Lab Results  Component Value Date   WBC 6.3 02/09/2018   HGB 14.9 02/09/2018   HCT 45.6 02/09/2018   MCV 88.0 02/09/2018   PLT 167 02/09/2018    Lab Results  Component Value Date   CREATININE 1.38 (H) 02/09/2018    No results found for: PSA  No results found for: TESTOSTERONE  No results found for: HGBA1C  Urinalysis    Component Value Date/Time   COLORURINE STRAW (A) 02/09/2018 1447   APPEARANCEUR CLEAR (A) 02/09/2018 1447   APPEARANCEUR Clear 04/23/2014 1606   LABSPEC 1.003 (L) 02/09/2018 1447   LABSPEC 1.012 04/23/2014 1606   PHURINE 6.0 02/09/2018 1447   GLUCOSEU NEGATIVE 02/09/2018 1447   GLUCOSEU Negative 04/23/2014 1606   HGBUR NEGATIVE 02/09/2018 1447   BILIRUBINUR NEGATIVE 02/09/2018 1447   BILIRUBINUR Negative 04/23/2014 1606   KETONESUR NEGATIVE 02/09/2018 1447   PROTEINUR NEGATIVE 02/09/2018 1447   NITRITE NEGATIVE 02/09/2018  1447   LEUKOCYTESUR NEGATIVE 02/09/2018 1447   LEUKOCYTESUR Negative 04/23/2014 1606    Pertinent Imaging: None  Assessment & Plan: The patient has frequency every 1 or 2 hours and he thinks Flomax helping.  I thought this is reasonable continue with it.  Flomax prescription given.  The patient and family wanted him to be seen here.  Reassess 1 year.  He does not need a serum PSA  1. Urinary frequency  - Urinalysis, Complete - BLADDER SCAN AMB NON-IMAGING   No follow-ups on file.  Martina SinnerMACDIARMID,Day Deery A, MD  [facility] [address], [address] [place], [place] [address] [phone]

## 2018-02-28 LAB — CULTURE, URINE COMPREHENSIVE

## 2018-03-12 NOTE — Discharge Instructions (Signed)

## 2018-03-13 ENCOUNTER — Encounter: Payer: Self-pay | Admitting: Anesthesiology

## 2018-03-14 ENCOUNTER — Encounter: Payer: Self-pay | Admitting: *Deleted

## 2018-03-14 ENCOUNTER — Encounter
Admission: RE | Disposition: A | Discharge status: Home / Self Care | Payer: Self-pay | Source: Ambulatory Visit | Attending: Ophthalmology

## 2018-03-14 ENCOUNTER — Ambulatory Visit: Payer: Medicare Other | Admitting: Anesthesiology

## 2018-03-14 ENCOUNTER — Ambulatory Visit
Admission: RE | Admit: 2018-03-14 | Discharge: 2018-03-14 | Disposition: A | Discharge status: Home / Self Care | Payer: Medicare Other | Source: Ambulatory Visit | Attending: Ophthalmology | Admitting: Ophthalmology

## 2018-03-14 DIAGNOSIS — H9190 Unspecified hearing loss, unspecified ear: Secondary | ICD-10-CM | POA: Diagnosis not present

## 2018-03-14 DIAGNOSIS — Z79899 Other long term (current) drug therapy: Secondary | ICD-10-CM | POA: Insufficient documentation

## 2018-03-14 DIAGNOSIS — H5703 Miosis: Secondary | ICD-10-CM | POA: Insufficient documentation

## 2018-03-14 DIAGNOSIS — Z880 Allergy status to penicillin: Secondary | ICD-10-CM | POA: Diagnosis not present

## 2018-03-14 DIAGNOSIS — H2512 Age-related nuclear cataract, left eye: Secondary | ICD-10-CM | POA: Diagnosis not present

## 2018-03-14 DIAGNOSIS — M25579 Pain in unspecified ankle and joints of unspecified foot: Secondary | ICD-10-CM | POA: Insufficient documentation

## 2018-03-14 DIAGNOSIS — Z9841 Cataract extraction status, right eye: Secondary | ICD-10-CM | POA: Insufficient documentation

## 2018-03-14 DIAGNOSIS — I10 Essential (primary) hypertension: Secondary | ICD-10-CM | POA: Diagnosis not present

## 2018-03-14 DIAGNOSIS — F17290 Nicotine dependence, other tobacco product, uncomplicated: Secondary | ICD-10-CM | POA: Diagnosis not present

## 2018-03-14 DIAGNOSIS — Z881 Allergy status to other antibiotic agents status: Secondary | ICD-10-CM | POA: Insufficient documentation

## 2018-03-14 HISTORY — PX: CATARACT EXTRACTION W/PHACO: SHX586

## 2018-03-14 SURGERY — PHACOEMULSIFICATION, CATARACT, WITH IOL INSERTION
Anesthesia: Monitor Anesthesia Care | Site: Eye | Laterality: Left | Wound class: "Clean "

## 2018-03-14 MED ORDER — LIDOCAINE HCL (PF) 2 % IJ SOLN
INTRAOCULAR | Status: DC | PRN
  Administered 2018-03-14: 1 mL

## 2018-03-14 MED ORDER — POLYMYXIN B-TRIMETHOPRIM 10000-0.1 UNIT/ML-% OP SOLN
OPHTHALMIC | Status: DC | PRN
  Administered 2018-03-14: 1 [drp] via OPHTHALMIC

## 2018-03-14 MED ORDER — FENTANYL CITRATE (PF) 100 MCG/2ML IJ SOLN
INTRAMUSCULAR | Status: DC | PRN
  Administered 2018-03-14: 50 ug via INTRAVENOUS

## 2018-03-14 MED ORDER — ARMC OPHTHALMIC DILATING DROPS
1.0000 "application " | OPHTHALMIC | Status: DC | PRN
  Administered 2018-03-14 (×3): 1 via OPHTHALMIC

## 2018-03-14 MED ORDER — MIDAZOLAM HCL 2 MG/2ML IJ SOLN
INTRAMUSCULAR | Status: DC | PRN
  Administered 2018-03-14: 1 mg via INTRAVENOUS

## 2018-03-14 MED ORDER — POLYMYXIN B-TRIMETHOPRIM 10000-0.1 UNIT/ML-% OP SOLN
1.0000 [drp] | OPHTHALMIC | Status: AC
  Administered 2018-03-14 (×3): 1 [drp] via OPHTHALMIC

## 2018-03-14 MED ORDER — EPINEPHRINE PF 1 MG/ML IJ SOLN
INTRAMUSCULAR | Status: DC | PRN
  Administered 2018-03-14: 51 mL via OPHTHALMIC

## 2018-03-14 MED ORDER — BRIMONIDINE TARTRATE-TIMOLOL 0.2-0.5 % OP SOLN
OPHTHALMIC | Status: DC | PRN
  Administered 2018-03-14: 1 [drp] via OPHTHALMIC

## 2018-03-14 MED ORDER — NA HYALUR & NA CHOND-NA HYALUR 0.4-0.35 ML IO KIT
PACK | INTRAOCULAR | Status: DC | PRN
  Administered 2018-03-14: 1 mL via INTRAOCULAR

## 2018-03-14 SURGICAL SUPPLY — 21 items
CANNULA ANT/CHMB 27G (MISCELLANEOUS) ×1 IMPLANT
CANNULA ANT/CHMB 27GA (MISCELLANEOUS) ×2 IMPLANT
GLOVE SURG LX 7.5 STRW (GLOVE) ×1
GLOVE SURG LX STRL 7.5 STRW (GLOVE) ×1 IMPLANT
GLOVE SURG TRIUMPH 8.0 PF LTX (GLOVE) ×2 IMPLANT
GOWN STRL REUS W/ TWL LRG LVL3 (GOWN DISPOSABLE) ×2 IMPLANT
GOWN STRL REUS W/TWL LRG LVL3 (GOWN DISPOSABLE) ×2
LENS IOL TECNIS ITEC 21.5 (Intraocular Lens) ×1 IMPLANT
MARKER SKIN DUAL TIP RULER LAB (MISCELLANEOUS) ×2 IMPLANT
NDL FILTER BLUNT 18X1 1/2 (NEEDLE) ×1 IMPLANT
NEEDLE FILTER BLUNT 18X 1/2SAF (NEEDLE) ×1
NEEDLE FILTER BLUNT 18X1 1/2 (NEEDLE) ×1 IMPLANT
PACK CATARACT BRASINGTON (MISCELLANEOUS) ×2 IMPLANT
PACK EYE AFTER SURG (MISCELLANEOUS) ×2 IMPLANT
PACK OPTHALMIC (MISCELLANEOUS) ×2 IMPLANT
RING MALYGIN 7.0 (MISCELLANEOUS) ×1 IMPLANT
SYR 3ML LL SCALE MARK (SYRINGE) ×2 IMPLANT
SYR 5ML LL (SYRINGE) ×2 IMPLANT
SYR TB 1ML LUER SLIP (SYRINGE) ×2 IMPLANT
WATER STERILE IRR 500ML POUR (IV SOLUTION) ×2 IMPLANT
WIPE NON LINTING 3.25X3.25 (MISCELLANEOUS) ×2 IMPLANT

## 2018-03-14 NOTE — Anesthesia Procedure Notes (Signed)
Procedure Name: MAC Performed by: Tyjai Charbonnet, CRNA Pre-anesthesia Checklist: Patient identified, Emergency Drugs available, Suction available, Patient being monitored and Timeout performed Patient Re-evaluated:Patient Re-evaluated prior to induction Oxygen Delivery Method: Nasal cannula       

## 2018-03-14 NOTE — H&P (Signed)
The History and Physical notes are on paper, have been signed, and are to be scanned. The patient remains stable and unchanged from the H&P.   Previous H&P reviewed, patient examined, and there are no changes.  Galo Sayed 03/14/2018 10:47 AM   

## 2018-03-14 NOTE — Anesthesia Postprocedure Evaluation (Signed)
Anesthesia Post Note  Patient: Frank Jefferson  Procedure(s) Performed: CATARACT EXTRACTION PHACO AND INTRAOCULAR LENS PLACEMENT (IOC) COMPLICATED LEFT (Left Eye)  Patient location during evaluation: PACU Anesthesia Type: MAC Level of consciousness: awake Pain management: pain level controlled Vital Signs Assessment: post-procedure vital signs reviewed and stable Respiratory status: spontaneous breathing Cardiovascular status: blood pressure returned to baseline Postop Assessment: no headache Anesthetic complications: no    Jaci Standard, III,  Sosha Shepherd D

## 2018-03-14 NOTE — Anesthesia Preprocedure Evaluation (Signed)
Anesthesia Evaluation  Patient identified by MRN, date of birth, ID band Patient awake    Reviewed: Allergy & Precautions, H&P , NPO status , Patient's Chart, lab work & pertinent test results  Airway Mallampati: II  TM Distance: >3 FB Neck ROM: full    Dental no notable dental hx.    Pulmonary neg pulmonary ROS,    Pulmonary exam normal        Cardiovascular hypertension, Normal cardiovascular exam     Neuro/Psych    GI/Hepatic negative GI ROS, Neg liver ROS,   Endo/Other  negative endocrine ROS  Renal/GU negative Renal ROS     Musculoskeletal   Abdominal   Peds  Hematology negative hematology ROS (+)   Anesthesia Other Findings   Reproductive/Obstetrics negative OB ROS                            Anesthesia Physical Anesthesia Plan  ASA: II  Anesthesia Plan: MAC   Post-op Pain Management:    Induction:   PONV Risk Score and Plan:   Airway Management Planned:   Additional Equipment:   Intra-op Plan:   Post-operative Plan:   Informed Consent: I have reviewed the patients History and Physical, chart, labs and discussed the procedure including the risks, benefits and alternatives for the proposed anesthesia with the patient or authorized representative who has indicated his/her understanding and acceptance.     Plan Discussed with:   Anesthesia Plan Comments:         Anesthesia Quick Evaluation

## 2018-03-14 NOTE — Op Note (Signed)
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     PROCEDURE:  Phacoemulsification with posterior chamber intraocular lens implantation of the left eye which required pupil stretching with the Malyugin pupil expansion device   LENS:   Implant Name Type Inv. Item Serial No. Manufacturer Lot No. LRB No. Used  LENS IOL DIOP 21.5 - [id number] Intraocular Lens LENS IOL DIOP 21.5 [id number] AMO  Left 1        ULTRASOUND TIME: 14 % of 0 minutes, 56 seconds.  CDE 8.1   SURGEON:  Deirdre Evener, MD   ANESTHESIA: Topical with tetracaine drops and 2% Xylocaine jelly, augmented with 1% preservative-free intracameral lidocaine.   COMPLICATIONS:  None.   DESCRIPTION OF PROCEDURE:  The patient was identified in the holding room and transported to the operating room and placed in the supine position under the operating microscope.  The left eye was identified as the operative eye and it was prepped and draped in the usual sterile ophthalmic fashion.   A 1 millimeter clear-corneal paracentesis was made at the 1:30 position.  The anterior chamber was filled with Viscoat viscoelastic.  0.5 ml of preservative-free 1% lidocaine was injected into the anterior chamber.  A 2.4 millimeter keratome was used to make a near-clear corneal incision at the 10:30 position.  A Malyugin pupil expander was then placed through the main incision and into the anterior chamber of the eye.  The edge of the iris was secured on the lip of the pupil expander and it was released, thereby expanding the pupil to approximately 7 millimeters for completion of the cataract surgery.  Additional Viscoat was placed in the anterior chamber.  A cystotome and capsulorrhexis forceps were used to make a curvilinear capsulorrhexis.   Balanced salt solution was  used to hydrodissect and hydrodelineate the lens nucleus.   Phacoemulsification was used in stop and chop fashion to remove the lens, nucleus and epinucleus.  The remaining cortex was aspirated using the irrigation aspiration handpiece.  Additional Provisc was placed into the eye to distend the capsular bag for lens placement.  A lens was then injected into the capsular bag.  The pupil expanding ring was removed using a Kuglen hook and insertion device. The remaining viscoelastic was aspirated from the capsular bag and the anterior chamber.  The anterior chamber was filled with balanced salt solution to inflate to a physiologic pressure.   Wounds were hydrated with balanced salt solution.  The anterior chamber was inflated to a physiologic pressure with balanced salt solution.  No wound leaks were noted.   Timolol and Brimonidine drops were applied to the eye.  The patient was taken to the recovery room in stable condition without complications of anesthesia or surgery.  [name] 03/14/2018, 11:35 AM

## 2018-03-14 NOTE — Transfer of Care (Signed)
Immediate Anesthesia Transfer of Care Note  Patient: Frank Jefferson  Procedure(s) Performed: CATARACT EXTRACTION PHACO AND INTRAOCULAR LENS PLACEMENT (IOC) COMPLICATED LEFT (Left Eye)  Patient Location: PACU  Anesthesia Type: MAC  Level of Consciousness: awake, alert  and patient cooperative  Airway and Oxygen Therapy: Patient Spontanous Breathing and Patient connected to supplemental oxygen  Post-op Assessment: Post-op Vital signs reviewed, Patient's Cardiovascular Status Stable, Respiratory Function Stable, Patent Airway and No signs of Nausea or vomiting  Post-op Vital Signs: Reviewed and stable  Complications: No apparent anesthesia complications

## 2018-03-15 ENCOUNTER — Encounter: Payer: Self-pay | Admitting: Ophthalmology

## 2018-08-15 ENCOUNTER — Emergency Department: Payer: Medicare Other

## 2018-08-15 ENCOUNTER — Other Ambulatory Visit: Payer: Self-pay

## 2018-08-15 ENCOUNTER — Encounter: Payer: Self-pay | Admitting: Emergency Medicine

## 2018-08-15 ENCOUNTER — Inpatient Hospital Stay
Admission: EM | Admit: 2018-08-15 | Discharge: 2018-08-16 | DRG: 683 | Disposition: A | Discharge status: Other Acute Inpatient Hospital | Payer: Medicare Other | Attending: Internal Medicine | Admitting: Internal Medicine

## 2018-08-15 DIAGNOSIS — N179 Acute kidney failure, unspecified: Secondary | ICD-10-CM | POA: Diagnosis present

## 2018-08-15 DIAGNOSIS — Z79899 Other long term (current) drug therapy: Secondary | ICD-10-CM

## 2018-08-15 DIAGNOSIS — D6959 Other secondary thrombocytopenia: Secondary | ICD-10-CM | POA: Diagnosis present

## 2018-08-15 DIAGNOSIS — I951 Orthostatic hypotension: Secondary | ICD-10-CM | POA: Diagnosis present

## 2018-08-15 DIAGNOSIS — Z881 Allergy status to other antibiotic agents status: Secondary | ICD-10-CM

## 2018-08-15 DIAGNOSIS — Z720 Tobacco use: Secondary | ICD-10-CM

## 2018-08-15 DIAGNOSIS — D696 Thrombocytopenia, unspecified: Secondary | ICD-10-CM

## 2018-08-15 DIAGNOSIS — I471 Supraventricular tachycardia: Secondary | ICD-10-CM | POA: Diagnosis not present

## 2018-08-15 DIAGNOSIS — A09 Infectious gastroenteritis and colitis, unspecified: Secondary | ICD-10-CM | POA: Diagnosis not present

## 2018-08-15 DIAGNOSIS — F1010 Alcohol abuse, uncomplicated: Secondary | ICD-10-CM | POA: Diagnosis not present

## 2018-08-15 DIAGNOSIS — Z880 Allergy status to penicillin: Secondary | ICD-10-CM | POA: Diagnosis not present

## 2018-08-15 DIAGNOSIS — Z888 Allergy status to other drugs, medicaments and biological substances status: Secondary | ICD-10-CM

## 2018-08-15 DIAGNOSIS — E860 Dehydration: Secondary | ICD-10-CM

## 2018-08-15 DIAGNOSIS — Z9114 Patient's other noncompliance with medication regimen: Secondary | ICD-10-CM | POA: Diagnosis not present

## 2018-08-15 DIAGNOSIS — I10 Essential (primary) hypertension: Secondary | ICD-10-CM | POA: Diagnosis present

## 2018-08-15 LAB — COMPREHENSIVE METABOLIC PANEL
ALK PHOS: 83 U/L (ref 38–126)
ALT: 42 U/L (ref 0–44)
AST: 88 U/L — ABNORMAL HIGH (ref 15–41)
Albumin: 4.2 g/dL (ref 3.5–5.0)
Anion gap: 15 (ref 5–15)
BUN: 11 mg/dL (ref 8–23)
CALCIUM: 9 mg/dL (ref 8.9–10.3)
CO2: 26 mmol/L (ref 22–32)
CREATININE: 1.73 mg/dL — AB (ref 0.61–1.24)
Chloride: 100 mmol/L (ref 98–111)
GFR calc non Af Amer: 34 mL/min — ABNORMAL LOW (ref >60)
GFR, EST AFRICAN AMERICAN: 40 mL/min — AB (ref >60)
Glucose, Bld: 113 mg/dL — ABNORMAL HIGH (ref 70–99)
Potassium: 4 mmol/L (ref 3.5–5.1)
Sodium: 141 mmol/L (ref 135–145)
Total Bilirubin: 1.9 mg/dL — ABNORMAL HIGH (ref 0.3–1.2)
Total Protein: 8.5 g/dL — ABNORMAL HIGH (ref 6.5–8.1)

## 2018-08-15 LAB — URINALYSIS, COMPLETE (UACMP) WITH MICROSCOPIC
Bacteria, UA: NONE SEEN
Bilirubin Urine: NEGATIVE
Glucose, UA: NEGATIVE mg/dL
KETONES UR: NEGATIVE mg/dL
Leukocytes, UA: NEGATIVE
Nitrite: NEGATIVE
PH: 5 (ref 5.0–8.0)
Protein, ur: 30 mg/dL — AB
SPECIFIC GRAVITY, URINE: 1.017 (ref 1.005–1.030)

## 2018-08-15 LAB — CBC WITH DIFFERENTIAL/PLATELET
ABS IMMATURE GRANULOCYTES: 0.02 10*3/uL (ref 0.00–0.07)
Basophils Absolute: 0.1 10*3/uL (ref 0.0–0.1)
Basophils Relative: 1 %
Eosinophils Absolute: 0 10*3/uL (ref 0.0–0.5)
Eosinophils Relative: 0 %
HCT: 48 % (ref 39.0–52.0)
HEMOGLOBIN: 16 g/dL (ref 13.0–17.0)
Immature Granulocytes: 0 %
Lymphocytes Relative: 32 %
Lymphs Abs: 2.2 10*3/uL (ref 0.7–4.0)
MCH: 30.8 pg (ref 26.0–34.0)
MCHC: 33.3 g/dL (ref 30.0–36.0)
MCV: 92.5 fL (ref 80.0–100.0)
MONO ABS: 0.5 10*3/uL (ref 0.1–1.0)
MONOS PCT: 7 %
NEUTROS ABS: 4 10*3/uL (ref 1.7–7.7)
Neutrophils Relative %: 60 %
Platelets: 70 10*3/uL — ABNORMAL LOW (ref 150–400)
RBC: 5.19 MIL/uL (ref 4.22–5.81)
RDW: 12.6 % (ref 11.5–15.5)
WBC: 6.8 10*3/uL (ref 4.0–10.5)
nRBC: 0 % (ref 0.0–0.2)

## 2018-08-15 LAB — ETHANOL: Alcohol, Ethyl (B): <10 mg/dL (ref <10)

## 2018-08-15 LAB — TROPONIN I

## 2018-08-15 MED ORDER — FAMOTIDINE 20 MG PO TABS
20.0000 mg | ORAL_TABLET | Freq: Every day | ORAL | Status: DC
  Administered 2018-08-15 – 2018-08-16 (×2): 20 mg via ORAL
  Filled 2018-08-15 (×2): qty 1

## 2018-08-15 MED ORDER — VITAMIN B-1 100 MG PO TABS
100.0000 mg | ORAL_TABLET | Freq: Every day | ORAL | Status: DC
  Administered 2018-08-15 – 2018-08-16 (×2): 100 mg via ORAL
  Filled 2018-08-15 (×2): qty 1

## 2018-08-15 MED ORDER — OXYCODONE-ACETAMINOPHEN 5-325 MG PO TABS
1.0000 | ORAL_TABLET | Freq: Once | ORAL | Status: AC
  Administered 2018-08-15: 1 via ORAL

## 2018-08-15 MED ORDER — ONDANSETRON HCL 4 MG/2ML IJ SOLN: 4.0000 mg | Freq: Four times a day (QID) | INTRAMUSCULAR | Status: DC | PRN

## 2018-08-15 MED ORDER — LORAZEPAM 2 MG/ML IJ SOLN: 1.0000 mg | Freq: Four times a day (QID) | INTRAMUSCULAR | Status: DC | PRN

## 2018-08-15 MED ORDER — OXYCODONE-ACETAMINOPHEN 5-325 MG PO TABS
ORAL_TABLET | ORAL | Status: AC
  Administered 2018-08-15: 1 via ORAL
  Filled 2018-08-15: qty 1

## 2018-08-15 MED ORDER — SODIUM CHLORIDE 0.9 % IV SOLN
1000.0000 mL | Freq: Once | INTRAVENOUS | Status: AC
  Administered 2018-08-15: 1000 mL via INTRAVENOUS

## 2018-08-15 MED ORDER — LOPERAMIDE HCL 2 MG PO CAPS
4.0000 mg | ORAL_CAPSULE | Freq: Once | ORAL | Status: AC
  Administered 2018-08-15: 4 mg via ORAL
  Filled 2018-08-15: qty 2

## 2018-08-15 MED ORDER — VITAMIN B-1 100 MG PO TABS: 100.0000 mg | ORAL_TABLET | Freq: Every day | ORAL | Status: DC

## 2018-08-15 MED ORDER — LORAZEPAM 1 MG PO TABS: 1.0000 mg | ORAL_TABLET | Freq: Four times a day (QID) | ORAL | Status: DC | PRN

## 2018-08-15 MED ORDER — PNEUMOCOCCAL VAC POLYVALENT 25 MCG/0.5ML IJ INJ
0.5000 mL | INJECTION | INTRAMUSCULAR | Status: DC
  Filled 2018-08-15: qty 0.5

## 2018-08-15 MED ORDER — THIAMINE HCL 100 MG/ML IJ SOLN
Freq: Once | INTRAVENOUS | Status: AC
  Administered 2018-08-15: 18:00:00 via INTRAVENOUS
  Filled 2018-08-15: qty 1000

## 2018-08-15 MED ORDER — TRAMADOL HCL 50 MG PO TABS
50.0000 mg | ORAL_TABLET | Freq: Four times a day (QID) | ORAL | Status: DC | PRN
  Administered 2018-08-15: 50 mg via ORAL
  Filled 2018-08-15: qty 1

## 2018-08-15 MED ORDER — FOLIC ACID 1 MG PO TABS
1.0000 mg | ORAL_TABLET | Freq: Every day | ORAL | Status: DC
  Administered 2018-08-15 – 2018-08-16 (×2): 1 mg via ORAL
  Filled 2018-08-15 (×2): qty 1

## 2018-08-15 MED ORDER — TRAMADOL HCL 50 MG PO TABS
50.0000 mg | ORAL_TABLET | Freq: Once | ORAL | Status: AC
  Administered 2018-08-15: 50 mg via ORAL
  Filled 2018-08-15: qty 1

## 2018-08-15 MED ORDER — THIAMINE HCL 100 MG/ML IJ SOLN
250.0000 mg | Freq: Once | INTRAMUSCULAR | Status: AC
  Administered 2018-08-15: 250 mg via INTRAVENOUS
  Filled 2018-08-15: qty 4

## 2018-08-15 MED ORDER — LORAZEPAM 2 MG PO TABS
0.0000 mg | ORAL_TABLET | Freq: Four times a day (QID) | ORAL | Status: DC
  Administered 2018-08-16: 2 mg via ORAL
  Filled 2018-08-15: qty 1

## 2018-08-15 MED ORDER — ONDANSETRON HCL 4 MG PO TABS: 4.0000 mg | ORAL_TABLET | Freq: Four times a day (QID) | ORAL | Status: DC | PRN

## 2018-08-15 MED ORDER — SODIUM CHLORIDE 0.9 % IV SOLN
Freq: Once | INTRAVENOUS | Status: AC
  Administered 2018-08-15: 10:00:00 via INTRAVENOUS

## 2018-08-15 MED ORDER — THIAMINE HCL 100 MG/ML IJ SOLN: 100.0000 mg | Freq: Every day | INTRAMUSCULAR | Status: DC

## 2018-08-15 MED ORDER — LORAZEPAM 2 MG PO TABS: 0.0000 mg | ORAL_TABLET | Freq: Two times a day (BID) | ORAL | Status: DC

## 2018-08-15 MED ORDER — ADULT MULTIVITAMIN W/MINERALS CH
1.0000 | ORAL_TABLET | Freq: Every day | ORAL | Status: DC
  Administered 2018-08-15 – 2018-08-16 (×2): 1 via ORAL
  Filled 2018-08-15 (×2): qty 1

## 2018-08-15 NOTE — H&P (Signed)
[facility] - [place] at [facility]   PATIENT NAME: Frank Jefferson    MR#:  [id number]  DATE OF BIRTH:  [birth year]  DATE OF ADMISSION:  08/15/2018  PRIMARY CARE PHYSICIAN: Hyman Hopes, MD   REQUESTING/REFERRING PHYSICIAN: Dr. Mayford Knife  CHIEF COMPLAINT:  Diarrhea, weakness and dizziness  HISTORY OF PRESENT ILLNESS:  Frank Jefferson  is a 84 y.o. male with a known history of alcohol abuse, hypertension, noncompliant with his medications is presenting to the ED with a chief complaint of diarrhea, back pain patient denies any falls or trauma.  Reports watery diarrhea 3 episodes so far with no blood.  Patient feels quite lightheaded when he stands up.  Significantly orthostatic in the emergency department.  Patient's heart rate is going up to 130s to 150s with SVT whenever he stands up patient has received 2 L of IV fluid boluses.  Patient denies any chest pain or shortness of breath or palpitations while laying flat.  Heart rate is well controlled while lying flat.  Admits to drinking liquor at least 3 times a week.  Platelet count is at 70,000 Hospitalist team is called to admit the patient.  No family members at bedside.  PAST MEDICAL HISTORY:   Past Medical History:  Diagnosis Date  . Alcohol abuse   . Hypertension     PAST SURGICAL HISTOIRY:   Past Surgical History:  Procedure Laterality Date  . CATARACT EXTRACTION W/PHACO Right 01/17/2018   Procedure: CATARACT EXTRACTION PHACO AND INTRAOCULAR LENS PLACEMENT (IOC) COMPLICATED RIGHT;  Surgeon: Lockie Mola, MD;  Location: [facility];  Service: Ophthalmology;  Laterality: Right;  . CATARACT EXTRACTION W/PHACO Left 03/14/2018   Procedure: CATARACT EXTRACTION PHACO AND INTRAOCULAR LENS PLACEMENT (IOC) COMPLICATED LEFT;  Surgeon: Lockie Mola, MD;  Location: [facility];  Service: Ophthalmology;  Laterality: Left;  . FOOT SURGERY Right    metal plate    SOCIAL HISTORY:    Social History   Tobacco Use  . Smoking status: Never Smoker  . Smokeless tobacco: Current User    Types: Snuff  Substance Use Topics  . Alcohol use: Yes    Comment: "1 40oz lasts all week"    FAMILY HISTORY:   Family History  Problem Relation Age of Onset  . Hypertension Mother   . Cancer Mother   . Cancer Brother     DRUG ALLERGIES:   Allergies  Allergen Reactions  . Ace Inhibitors Anaphylaxis  . Ciprofloxacin Anaphylaxis  . Penicillins Anaphylaxis    REVIEW OF SYSTEMS:  CONSTITUTIONAL: No fever, reporting fatigue or weakness.  EYES: No blurred or double vision.  EARS, NOSE, AND THROAT: No tinnitus or ear pain.  RESPIRATORY: No cough, shortness of breath, wheezing or hemoptysis.  CARDIOVASCULAR: No chest pain, orthopnea, edema.  GASTROINTESTINAL: No nausea, vomiting, or abdominal pain.  Reports 3 episodes of watery diarrhea GENITOURINARY: No dysuria, hematuria.  ENDOCRINE: No polyuria, nocturia,  HEMATOLOGY: No anemia, easy bruising or bleeding SKIN: No rash or lesion. MUSCULOSKELETAL: No joint pain or arthritis.   NEUROLOGIC: No tingling, numbness, weakness.  PSYCHIATRY: No anxiety or depression.   MEDICATIONS AT HOME:   Prior to Admission medications   Medication Sig Start Date End Date Taking? Authorizing Provider  amLODipine (NORVASC) 10 MG tablet Take 10 mg by mouth daily.   Yes [provider]  tamsulosin (FLOMAX) 0.4 MG CAPS capsule Take 1 capsule (0.4 mg total) by mouth daily. 02/26/18  Yes MacDiarmid, Lorin Picket, MD  thiamine (VITAMIN B-1) 100  MG tablet Take 100 mg by mouth daily.   Yes [provider]      VITAL SIGNS:  Blood pressure (!) 135/101, pulse 96, temperature 97.7 F (36.5 C), temperature source Oral, resp. rate (!) 21, height 5\' 4"  (1.626 m), weight 54.6 kg, SpO2 98 %.  PHYSICAL EXAMINATION:  GENERAL:  84 y.o.-year-old patient lying in the bed with no acute distress.  Emaciated EYES: Pupils equal, round, reactive to  light and accommodation. No scleral icterus. Extraocular muscles intact.  HEENT: Head atraumatic, normocephalic. Oropharynx and nasopharynx clear.  Dry mucous membranes NECK:  Supple, no jugular venous distention. No thyroid enlargement, no tenderness.  LUNGS: Normal breath sounds bilaterally, no wheezing, rales,rhonchi or crepitation. No use of accessory muscles of respiration.  CARDIOVASCULAR: S1, S2 normal. No murmurs, rubs, or gallops.  ABDOMEN: Soft, nontender, nondistended. Bowel sounds present.  EXTREMITIES: No pedal edema, cyanosis, or clubbing.  NEUROLOGIC: Cranial nerves II through XII are intact. Muscle strength 5/5 in all extremities. Sensation intact. Gait not checked.  PSYCHIATRIC: The patient is alert and oriented x 3.  SKIN: No obvious rash, lesion, or ulcer.   LABORATORY PANEL:   CBC Recent Labs  Lab 08/15/18 0718  WBC 6.8  HGB 16.0  HCT 48.0  PLT 70*   ------------------------------------------------------------------------------------------------------------------  Chemistries  Recent Labs  Lab 08/15/18 0718  NA 141  K 4.0  CL 100  CO2 26  GLUCOSE 113*  BUN 11  CREATININE 1.73*  CALCIUM 9.0  AST 88*  ALT 42  ALKPHOS 83  BILITOT 1.9*   ------------------------------------------------------------------------------------------------------------------  Cardiac Enzymes Recent Labs  Lab 08/15/18 0718  TROPONINI <0.03   ------------------------------------------------------------------------------------------------------------------  RADIOLOGY:  Dg Chest 2 View  Result Date: 08/15/2018 CLINICAL DATA:  Dizziness with weakness.  Hypertension. EXAM: CHEST - 2 VIEW COMPARISON:  December 01, 2015 FINDINGS: There is no edema or consolidation. Heart size and pulmonary vascularity are normal. No adenopathy. There is aortic atherosclerosis. There is degenerative change in each shoulder and in the thoracic spine. IMPRESSION: No edema or consolidation. Stable  cardiac silhouette. There is aortic atherosclerosis. Aortic Atherosclerosis (ICD10-I70.0). Electronically Signed   By: Bretta Bang III M.D.   On: 08/15/2018 08:05    EKG:   Orders placed or performed during the hospital encounter of 08/15/18  . EKG 12-Lead  . EKG 12-Lead  . EKG 12-Lead  . EKG 12-Lead    IMPRESSION AND PLAN:   Freeman Borba  is a 84 y.o. male with a known history of alcohol abuse, hypertension, noncompliant with his medications is presenting to the ED with a chief complaint of diarrhea, back pain patient denies any falls or trauma.  Reports watery diarrhea 3 episodes so far with no blood.  Patient feels quite lightheaded when he stands up.  Significantly orthostatic in the emergency department.  Patient's heart rate is going up to 130s to 150s with SVT whenever he stands up  #AKI secondary to dehydration from diarrhea Admit to telemetry Hydrate with IV fluids.  Patient has received 2 L of fluid boluses in the emergency department Monitor renal function and avoid nephrotoxins   #Orthostatic hypotension from dehydration Hydrate with IV fluids  #Paroxysmal SVT Patient being dehydrated significantly orthostatic and whenever he stands up he is going into PSVT, resting comfortably while laying flat without tachycardia Continue close monitoring the patient on telemetry Check TSH  #Diarrhea-could be viral Enteric precautions Check stool for C. difficile toxin and GI panel  #Alcohol abuse Counseled patient to quit smoking CIWA  protocol IV fluids with multivitamin, thiamine and folate Outpatient alcohol Anonymous is advised  #Thrombocytopenia from chronic liquid intake No active bleeding or bruises Monitor platelet count closely  #History of essential hypertension  patient has stopped taking his medications for several months as he has been not feeling good   #Noncompliance with medications Reinforced the importance of being compliant with the medications  he verbalized understanding  #DVT prophylaxis with SCDs as patient is thrombocytopenic GI prophylaxis with Pepcid    All the records are reviewed and case discussed with ED provider. Management plans discussed with the patient, family and they are in agreement.  CODE STATUS: fc ,brother HCPOA  TOTAL TIME TAKING CARE OF THIS PATIENT: 43 minutes.   Note: This dictation was prepared with Dragon dictation along with smaller phrase technology. Any transcriptional errors that result from this process are unintentional.  Ramonita Lab M.D on 08/15/2018 at 1:43 PM  Between 7am to 6pm - Pager - [phone]  After 6pm go to [email or web address] - password [email or web address] [facility]  [facility]  Office  [phone]  CC: Primary care physician; Hyman Hopes, MD

## 2018-08-15 NOTE — ED Notes (Signed)
Pt notified he will be d/c once 1L bolus is finished. Pt denies any needs & is resting comfortably.

## 2018-08-15 NOTE — ED Provider Notes (Addendum)
[facility] Emergency Department Provider Note       Time seen: ----------------------------------------- 7:18 AM on 08/15/2018 -----------------------------------------   I have reviewed the triage vital signs and the nursing notes.  HISTORY   Chief Complaint Weakness; Diarrhea; and Back Pain    HPI Frank Jefferson is a 84 y.o. male with a history of alcohol abuse and hypertension who presents to the ED for upper back and shoulder pain that started yesterday.  Patient denies any falls or trauma.  Also reports dizziness and weakness started when he woke up this morning.  He reports diarrhea that started today, around 3 episodes.  When he is up walking around he feels lightheaded.  He also has not been taking his medications for the past 3 months.  Past Medical History:  Diagnosis Date  . Alcohol abuse   . Hypertension     There are no active problems to display for this patient.   Past Surgical History:  Procedure Laterality Date  . CATARACT EXTRACTION W/PHACO Right 01/17/2018   Procedure: CATARACT EXTRACTION PHACO AND INTRAOCULAR LENS PLACEMENT (IOC) COMPLICATED RIGHT;  Surgeon: Lockie Mola, MD;  Location: [facility];  Service: Ophthalmology;  Laterality: Right;  . CATARACT EXTRACTION W/PHACO Left 03/14/2018   Procedure: CATARACT EXTRACTION PHACO AND INTRAOCULAR LENS PLACEMENT (IOC) COMPLICATED LEFT;  Surgeon: Lockie Mola, MD;  Location: [facility];  Service: Ophthalmology;  Laterality: Left;  . FOOT SURGERY Right    metal plate    Allergies Ace inhibitors; Ciprofloxacin; and Penicillins  Social History Social History   Tobacco Use  . Smoking status: Never Smoker  . Smokeless tobacco: Current User    Types: Snuff  Substance Use Topics  . Alcohol use: Yes    Comment: "1 40oz lasts all week"  . Drug use: No   Review of Systems Constitutional: Negative for fever. Cardiovascular: Negative for chest  pain. Respiratory: Negative for shortness of breath. Gastrointestinal: Negative for abdominal pain, vomiting and diarrhea. Genitourinary: Negative for dysuria. Musculoskeletal: Positive for back pain Skin: Negative for rash. Neurological: Positive for weakness  All systems negative/normal/unremarkable except as stated in the HPI  ____________________________________________   PHYSICAL EXAM:  VITAL SIGNS: ED Triage Vitals  Enc Vitals Group     BP 08/15/18 0710 (!) 142/103     Pulse Rate 08/15/18 0710 (!) 105     Resp 08/15/18 0710 18     Temp 08/15/18 0710 (!) 87.6 F (30.9 C)     Temp src --      SpO2 08/15/18 0710 95 %     Weight 08/15/18 0709 128 lb 1.4 oz (58.1 kg)     Height 08/15/18 0709 5\' 6"  (1.676 m)     Head Circumference --      Peak Flow --      Pain Score 08/15/18 0709 8     Pain Loc --      Pain Edu? --      Excl. in GC? --    Constitutional: Alert and oriented.  Chronically ill-appearing, no distress Eyes: Conjunctivae are normal. Normal extraocular movements. ENT   Head: Normocephalic and atraumatic.   Nose: No congestion/rhinnorhea.   Mouth/Throat: Mucous membranes are moist.   Neck: No stridor. Cardiovascular: Normal rate, regular rhythm. No murmurs, rubs, or gallops. Respiratory: Normal respiratory effort without tachypnea nor retractions. Breath sounds are clear and equal bilaterally.  Crackles in the lung bases Gastrointestinal: Soft and nontender. Normal bowel sounds Musculoskeletal: Nontender with normal range of  motion in extremities. No lower extremity tenderness nor edema. Neurologic:  Normal speech and language. No gross focal neurologic deficits are appreciated.  Skin:  Skin is warm, dry and intact. No rash noted. Psychiatric: Mood and affect are normal. Speech and behavior are normal.  ____________________________________________  EKG: Interpreted by me.  Sinus rhythm the rate of 95 bpm, left axis deviation, likely inferior  infarct age-indeterminate, normal QT  Repeat EKG interpreted by me: SVT with a rate of 134 bpm, incomplete right bundle branch block, left axis deviation. ____________________________________________  ED COURSE:  As part of my medical decision making, I reviewed the following data within the electronic MEDICAL RECORD NUMBER History obtained from family if available, nursing notes, old chart and ekg, as well as notes from prior ED visits. Patient presented for weakness, diarrhea and back pain, we will assess with labs and imaging as indicated at this time. Clinical Course as of Aug 15 1021  Wed Aug 15, 2018  1022 Patient ambulates without any difficulty.   [JW]    Clinical Course User Index [JW] [name], MD   Procedures ____________________________________________   LABS (pertinent positives/negatives)  Labs Reviewed  CBC WITH DIFFERENTIAL/PLATELET - Abnormal; Notable for the following components:      Result Value   Platelets 70 (*)    All other components within normal limits  COMPREHENSIVE METABOLIC PANEL - Abnormal; Notable for the following components:   Glucose, Bld 113 (*)    Creatinine, Ser 1.73 (*)    Total Protein 8.5 (*)    AST 88 (*)    Total Bilirubin 1.9 (*)    GFR calc non Af Amer 34 (*)    GFR calc Af Amer 40 (*)    All other components within normal limits  URINALYSIS, COMPLETE (UACMP) WITH MICROSCOPIC - Abnormal; Notable for the following components:   Color, Urine AMBER (*)    APPearance CLEAR (*)    Hgb urine dipstick SMALL (*)    Protein, ur 30 (*)    All other components within normal limits  TROPONIN I  ETHANOL  CBG MONITORING, ED    RADIOLOGY Images were viewed by me  Chest x-ray Does not reveal any acute process ____________________________________________  DIFFERENTIAL DIAGNOSIS   Dehydration, electrolyte abnormality, chronic alcohol abuse, vitamin deficiency, occult infection, fracture, pneumonia, CHF  FINAL ASSESSMENT AND  PLAN  Weakness, diarrhea, dehydration, back pain   Plan: The patient had presented for weakness, diarrhea and back pain. Patient's labs are grossly within normal limits for him although he is more thrombocytopenic than previously noted.  He also appeared somewhat dehydrated and we gave him IV fluids and he was orthostatic on examination here.  His orthostasis did not really improve after 2 L of fluid and I am not sure why.  Patient's imaging did not reveal any acute process.  He is complaining of some right shoulder pain and upper back pain but it is nonspecific.  We did give him a dose of IV thiamine and started back on all of his medications which his family brought with him for his hypertension.  When he stands up he gets very tachycardic and has been seen to approach 160.  I will discuss with the hospitalist for admission.   Ulice Dash, MD   Note: This note was generated in part or whole with voice recognition software. Voice recognition is usually quite accurate but there are transcription errors that can and very often do occur. I apologize for any typographical  errors that were not detected and corrected.     [name], MD 08/15/18 8119    [name], MD 08/15/18 1003    [name], MD 08/15/18 1022    [name], MD 08/15/18 [phone]

## 2018-08-15 NOTE — Progress Notes (Signed)
Family Meeting Note  Advance Directive:yes  Today a meeting took place with the Patient.   The following clinical team members were present during this meeting:MD  The following were discussed:Patient's diagnosis: Acute kidney injury, diarrhea, paroxysmal SVT, orthostatic hypotension, near syncope, thrombocytopenia, alcohol abuse, noncompliance with medications, history of essential hypertension, treatment plan of care discussed in detail with the patient.  He verbalized understanding of the plan.    Patient's progosis: Unable to determine and Goals for treatment: Full Code, brother Prosch is HCPOA  Additional follow-up to be provided: hospitalist  Time spent during discussion:17 min  Ramonita Lab, MD

## 2018-08-15 NOTE — ED Notes (Signed)
Pt leaving for x-ray.  ?

## 2018-08-15 NOTE — Progress Notes (Signed)
Patient alert and oriented,settled in room. Very hard of hearing. Bed in lowest position, and bed alarm on. Patient requested for wallet and money to be stored with security. Paper filled out, security called. Will continue to monitor.

## 2018-08-15 NOTE — ED Notes (Signed)
Pt's fingers won't bleed enough for CBG. Dr Mayford Knife notified.

## 2018-08-15 NOTE — ED Triage Notes (Signed)
Pt arrived with cousin with concerns over upper back/shoulder pain that started yesterday. Pt denies injury. Pt also reports dizziness/weakness that started when he woke up this am. Pt reports diarrhea that started today,3 episodes.

## 2018-08-16 DIAGNOSIS — D696 Thrombocytopenia, unspecified: Secondary | ICD-10-CM | POA: Insufficient documentation

## 2018-08-16 DIAGNOSIS — H10023 Other mucopurulent conjunctivitis, bilateral: Secondary | ICD-10-CM | POA: Insufficient documentation

## 2018-08-16 DIAGNOSIS — I739 Peripheral vascular disease, unspecified: Secondary | ICD-10-CM | POA: Insufficient documentation

## 2018-08-16 DIAGNOSIS — G9340 Encephalopathy, unspecified: Secondary | ICD-10-CM | POA: Insufficient documentation

## 2018-08-16 DIAGNOSIS — N179 Acute kidney failure, unspecified: Secondary | ICD-10-CM | POA: Diagnosis not present

## 2018-08-16 LAB — COMPREHENSIVE METABOLIC PANEL
ALBUMIN: 3 g/dL — AB (ref 3.5–5.0)
ALT: 26 U/L (ref 0–44)
AST: 52 U/L — AB (ref 15–41)
Alkaline Phosphatase: 52 U/L (ref 38–126)
Anion gap: 9 (ref 5–15)
BILIRUBIN TOTAL: 1.3 mg/dL — AB (ref 0.3–1.2)
BUN: 9 mg/dL (ref 8–23)
CO2: 22 mmol/L (ref 22–32)
CREATININE: 1.2 mg/dL (ref 0.61–1.24)
Calcium: 7.4 mg/dL — ABNORMAL LOW (ref 8.9–10.3)
Chloride: 109 mmol/L (ref 98–111)
GFR calc Af Amer: >60 mL/min (ref >60)
GFR, EST NON AFRICAN AMERICAN: 54 mL/min — AB (ref >60)
GLUCOSE: 119 mg/dL — AB (ref 70–99)
POTASSIUM: 3.3 mmol/L — AB (ref 3.5–5.1)
Sodium: 140 mmol/L (ref 135–145)
TOTAL PROTEIN: 5.9 g/dL — AB (ref 6.5–8.1)

## 2018-08-16 LAB — CBC
HEMATOCRIT: 36 % — AB (ref 39.0–52.0)
HEMOGLOBIN: 11.9 g/dL — AB (ref 13.0–17.0)
MCH: 30.7 pg (ref 26.0–34.0)
MCHC: 33.1 g/dL (ref 30.0–36.0)
MCV: 93 fL (ref 80.0–100.0)
Platelets: 47 10*3/uL — ABNORMAL LOW (ref 150–400)
RBC: 3.87 MIL/uL — ABNORMAL LOW (ref 4.22–5.81)
RDW: 12.6 % (ref 11.5–15.5)
WBC: 6.6 10*3/uL (ref 4.0–10.5)
nRBC: 0 % (ref 0.0–0.2)

## 2018-08-16 LAB — TSH: TSH: 2.474 u[IU]/mL (ref 0.350–4.500)

## 2018-08-16 MED ORDER — THIAMINE HCL 100 MG PO TABS: 100.0000 mg | ORAL_TABLET | Freq: Every day | ORAL | Status: DC

## 2018-08-16 NOTE — Discharge Summary (Signed)
[facility] [place] [facility]   PATIENT NAME: Frank Jefferson    MR#:  [id number]  DATE OF BIRTH:  October 15, 1931  DATE OF ADMISSION:  08/15/2018 ADMITTING PHYSICIAN: Ramonita Lab, MD  DATE OF DISCHARGE: 08/16/2018  PRIMARY CARE PHYSICIAN: Hyman Hopes, MD    ADMISSION DIAGNOSIS:  Orthostatic hypotension [I95.1] Dehydration [E86.0] Diarrhea of infectious origin [A09] Thrombocytopenia (HCC) [D69.6]  DISCHARGE DIAGNOSIS:  Active Problems:   AKI (acute kidney injury) (HCC)   SECONDARY DIAGNOSIS:   Past Medical History:  Diagnosis Date  . Alcohol abuse   . Hypertension     HOSPITAL COURSE:   84 year old male with history of alcohol abuse who presented to the ED are due to diarrhea and weakness.  1.  Acute kidney injury due to dehydration with orthostatic hypotension: This is improved with IV fluids.  2.  Diarrhea: Patient had no diarrhea or bowel movements while in the hospital.  3.  Thrombocytopenia: This is due to EtOH abuse: Patient will have outpatient follow-up with oncology.  4.  EtOH abuse: Patient was on CIWA protocol without uneventful detox.  5.  History of essential hypertension: Patient is noncompliant with medications and should follow-up with PCP 6.  SVT: This is resolved and was due to dehydration DISCHARGE CONDITIONS AND DIET:    Stable for discharge on regular diet CONSULTS OBTAINED:    DRUG ALLERGIES:   Allergies  Allergen Reactions  . Ace Inhibitors Anaphylaxis  . Ciprofloxacin Anaphylaxis  . Penicillins Anaphylaxis    DISCHARGE MEDICATIONS:   Allergies as of 08/16/2018      Reactions   Ace Inhibitors Anaphylaxis   Ciprofloxacin Anaphylaxis   Penicillins Anaphylaxis      Medication List    TAKE these medications   amLODipine 10 MG tablet Commonly known as:  NORVASC Take 10 mg by mouth daily.   tamsulosin 0.4 MG Caps capsule Commonly known as:  FLOMAX Take 1 capsule (0.4 mg total) by mouth daily.    thiamine 100 MG tablet Commonly known as:  VITAMIN B-1 Take 100 mg by mouth daily.         Today   CHIEF COMPLAINT:  Patient is doing well this morning.  No diarrhea   VITAL SIGNS:  Blood pressure 134/85, pulse 86, temperature 99 F (37.2 C), temperature source Oral, resp. rate 16, height 5\' 5"  (1.651 m), weight 52.6 kg, SpO2 98 %.   REVIEW OF SYSTEMS:  Review of Systems  Constitutional: Negative.  Negative for chills, fever and malaise/fatigue.  HENT: Negative.  Negative for ear discharge, ear pain, hearing loss, nosebleeds and sore throat.   Eyes: Negative.  Negative for blurred vision and pain.  Respiratory: Negative.  Negative for cough, hemoptysis, shortness of breath and wheezing.   Cardiovascular: Negative.  Negative for chest pain, palpitations and leg swelling.  Gastrointestinal: Negative.  Negative for abdominal pain, blood in stool, diarrhea, nausea and vomiting.  Genitourinary: Negative.  Negative for dysuria.  Musculoskeletal: Negative.  Negative for back pain.  Skin: Negative.   Neurological: Negative for dizziness, tremors, speech change, focal weakness, seizures and headaches.  Endo/Heme/Allergies: Negative.  Does not bruise/bleed easily.  Psychiatric/Behavioral: Negative.  Negative for depression, hallucinations and suicidal ideas.     PHYSICAL EXAMINATION:  GENERAL:  84 y.o.-year-old patient lying in the bed with no acute distress.  Thin and frail NECK:  Supple, no jugular venous distention. No thyroid enlargement, no tenderness.  LUNGS: Normal breath sounds bilaterally, no wheezing, rales,rhonchi  No use of  accessory muscles of respiration.  CARDIOVASCULAR: S1, S2 normal. No murmurs, rubs, or gallops.  ABDOMEN: Soft, non-tender, non-distended. Bowel sounds present. No organomegaly or mass.  EXTREMITIES: No pedal edema, cyanosis, or clubbing.  PSYCHIATRIC: The patient is alert and oriented x 3.  SKIN: No obvious rash, lesion, or ulcer.   DATA  REVIEW:   CBC Recent Labs  Lab 08/16/18 0356  WBC 6.6  HGB 11.9*  HCT 36.0*  PLT 47*    Chemistries  Recent Labs  Lab 08/16/18 0356  NA 140  K 3.3*  CL 109  CO2 22  GLUCOSE 119*  BUN 9  CREATININE 1.20  CALCIUM 7.4*  AST 52*  ALT 26  ALKPHOS 52  BILITOT 1.3*    Cardiac Enzymes Recent Labs  Lab 08/15/18 0718  TROPONINI <0.03    Microbiology Results  @[email or web address] @  RADIOLOGY:  Dg Chest 2 View  Result Date: 08/15/2018 CLINICAL DATA:  Dizziness with weakness.  Hypertension. EXAM: CHEST - 2 VIEW COMPARISON:  December 01, 2015 FINDINGS: There is no edema or consolidation. Heart size and pulmonary vascularity are normal. No adenopathy. There is aortic atherosclerosis. There is degenerative change in each shoulder and in the thoracic spine. IMPRESSION: No edema or consolidation. Stable cardiac silhouette. There is aortic atherosclerosis. Aortic Atherosclerosis (ICD10-I70.0). Electronically Signed   By: Bretta Bang III M.D.   On: 08/15/2018 08:05      Allergies as of 08/16/2018      Reactions   Ace Inhibitors Anaphylaxis   Ciprofloxacin Anaphylaxis   Penicillins Anaphylaxis      Medication List    TAKE these medications   amLODipine 10 MG tablet Commonly known as:  NORVASC Take 10 mg by mouth daily.   tamsulosin 0.4 MG Caps capsule Commonly known as:  FLOMAX Take 1 capsule (0.4 mg total) by mouth daily.   thiamine 100 MG tablet Commonly known as:  VITAMIN B-1 Take 100 mg by mouth daily.          Management plans discussed with the patient and he is in agreement. Stable for discharge home  Patient should follow up with pcp  CODE STATUS:     Code Status Orders  (From admission, onward)         Start     Ordered   08/15/18 1536  Full code  Continuous     08/15/18 1535        Code Status History    This patient has a current code status but no historical code status.      TOTAL TIME TAKING CARE OF THIS PATIENT: 38 minutes.     Note: This dictation was prepared with Dragon dictation along with smaller phrase technology. Any transcriptional errors that result from this process are unintentional.  Janan Bogie M.D on 08/16/2018 at 10:50 AM  Between 7am to 6pm - Pager - [phone] After 6pm go to [email or web address] - Social research officer, government  [facility]  Office  [phone]  CC: Primary care physician; Hyman Hopes, MD

## 2018-08-16 NOTE — Progress Notes (Signed)
Pt discharging home. IV and heart monitor removed. Discharge instructions and education reviewed with pt. He is disoriented to situation and time, will review discharge instructions  And education with family members as well.

## 2018-08-16 NOTE — Plan of Care (Signed)
  Problem: Clinical Measurements: Goal: Respiratory complications will improve Outcome: Progressing   Problem: Activity: Goal: Risk for activity intolerance will decrease Outcome: Progressing   

## 2019-03-04 ENCOUNTER — Ambulatory Visit: Payer: Medicare Other | Admitting: Urology

## 2019-04-03 ENCOUNTER — Other Ambulatory Visit: Payer: Self-pay

## 2019-04-03 ENCOUNTER — Emergency Department: Payer: Medicare Other

## 2019-04-03 ENCOUNTER — Encounter: Payer: Self-pay | Admitting: Medical Oncology

## 2019-04-03 ENCOUNTER — Inpatient Hospital Stay
Admission: EM | Admit: 2019-04-03 | Discharge: 2019-04-05 | DRG: 247 | Disposition: A | Discharge status: Home / Self Care | Payer: Medicare Other | Attending: Internal Medicine | Admitting: Internal Medicine

## 2019-04-03 ENCOUNTER — Encounter
Admission: EM | Disposition: A | Discharge status: Home / Self Care | Payer: Self-pay | Source: Home / Self Care | Attending: Internal Medicine

## 2019-04-03 DIAGNOSIS — F1010 Alcohol abuse, uncomplicated: Secondary | ICD-10-CM | POA: Diagnosis present

## 2019-04-03 DIAGNOSIS — Z888 Allergy status to other drugs, medicaments and biological substances status: Secondary | ICD-10-CM

## 2019-04-03 DIAGNOSIS — N179 Acute kidney failure, unspecified: Secondary | ICD-10-CM | POA: Diagnosis present

## 2019-04-03 DIAGNOSIS — Z880 Allergy status to penicillin: Secondary | ICD-10-CM

## 2019-04-03 DIAGNOSIS — R1084 Generalized abdominal pain: Secondary | ICD-10-CM | POA: Diagnosis present

## 2019-04-03 DIAGNOSIS — Z1159 Encounter for screening for other viral diseases: Secondary | ICD-10-CM | POA: Diagnosis not present

## 2019-04-03 DIAGNOSIS — G8929 Other chronic pain: Secondary | ICD-10-CM | POA: Diagnosis present

## 2019-04-03 DIAGNOSIS — Z9841 Cataract extraction status, right eye: Secondary | ICD-10-CM

## 2019-04-03 DIAGNOSIS — I213 ST elevation (STEMI) myocardial infarction of unspecified site: Secondary | ICD-10-CM | POA: Diagnosis present

## 2019-04-03 DIAGNOSIS — Z9842 Cataract extraction status, left eye: Secondary | ICD-10-CM

## 2019-04-03 DIAGNOSIS — I2510 Atherosclerotic heart disease of native coronary artery without angina pectoris: Secondary | ICD-10-CM | POA: Diagnosis present

## 2019-04-03 DIAGNOSIS — Z961 Presence of intraocular lens: Secondary | ICD-10-CM | POA: Diagnosis present

## 2019-04-03 DIAGNOSIS — Z8249 Family history of ischemic heart disease and other diseases of the circulatory system: Secondary | ICD-10-CM | POA: Diagnosis not present

## 2019-04-03 DIAGNOSIS — E785 Hyperlipidemia, unspecified: Secondary | ICD-10-CM | POA: Diagnosis present

## 2019-04-03 DIAGNOSIS — I129 Hypertensive chronic kidney disease with stage 1 through stage 4 chronic kidney disease, or unspecified chronic kidney disease: Secondary | ICD-10-CM | POA: Diagnosis present

## 2019-04-03 DIAGNOSIS — N183 Chronic kidney disease, stage 3 (moderate): Secondary | ICD-10-CM | POA: Diagnosis present

## 2019-04-03 DIAGNOSIS — G2581 Restless legs syndrome: Secondary | ICD-10-CM | POA: Diagnosis present

## 2019-04-03 DIAGNOSIS — Z720 Tobacco use: Secondary | ICD-10-CM | POA: Diagnosis not present

## 2019-04-03 DIAGNOSIS — I2119 ST elevation (STEMI) myocardial infarction involving other coronary artery of inferior wall: Secondary | ICD-10-CM | POA: Diagnosis present

## 2019-04-03 DIAGNOSIS — Z87892 Personal history of anaphylaxis: Secondary | ICD-10-CM

## 2019-04-03 DIAGNOSIS — Z79899 Other long term (current) drug therapy: Secondary | ICD-10-CM

## 2019-04-03 DIAGNOSIS — Z883 Allergy status to other anti-infective agents status: Secondary | ICD-10-CM | POA: Diagnosis not present

## 2019-04-03 HISTORY — DX: Chronic kidney disease, unspecified: N18.9

## 2019-04-03 HISTORY — PX: LEFT HEART CATH AND CORONARY ANGIOGRAPHY: CATH118249

## 2019-04-03 HISTORY — PX: CORONARY/GRAFT ACUTE MI REVASCULARIZATION: CATH118305

## 2019-04-03 LAB — COMPREHENSIVE METABOLIC PANEL
ALT: 10 U/L (ref 0–44)
AST: 28 U/L (ref 15–41)
Albumin: 3.8 g/dL (ref 3.5–5.0)
Alkaline Phosphatase: 88 U/L (ref 38–126)
Anion gap: 10 (ref 5–15)
BUN: 15 mg/dL (ref 8–23)
CO2: 28 mmol/L (ref 22–32)
Calcium: 8.9 mg/dL (ref 8.9–10.3)
Chloride: 102 mmol/L (ref 98–111)
Creatinine, Ser: 1.33 mg/dL — ABNORMAL HIGH (ref 0.61–1.24)
GFR calc Af Amer: 56 mL/min — ABNORMAL LOW (ref >60)
GFR calc non Af Amer: 48 mL/min — ABNORMAL LOW (ref >60)
Glucose, Bld: 112 mg/dL — ABNORMAL HIGH (ref 70–99)
Potassium: 4.3 mmol/L (ref 3.5–5.1)
Sodium: 140 mmol/L (ref 135–145)
Total Bilirubin: 1 mg/dL (ref 0.3–1.2)
Total Protein: 7.4 g/dL (ref 6.5–8.1)

## 2019-04-03 LAB — CBC WITH DIFFERENTIAL/PLATELET
Abs Immature Granulocytes: 0.02 10*3/uL (ref 0.00–0.07)
Basophils Absolute: 0 10*3/uL (ref 0.0–0.1)
Basophils Relative: 0 %
Eosinophils Absolute: 0.3 10*3/uL (ref 0.0–0.5)
Eosinophils Relative: 3 %
HCT: 46.9 % (ref 39.0–52.0)
Hemoglobin: 14.9 g/dL (ref 13.0–17.0)
Immature Granulocytes: 0 %
Lymphocytes Relative: 25 %
Lymphs Abs: 2.3 10*3/uL (ref 0.7–4.0)
MCH: 26.1 pg (ref 26.0–34.0)
MCHC: 31.8 g/dL (ref 30.0–36.0)
MCV: 82.1 fL (ref 80.0–100.0)
Monocytes Absolute: 0.6 10*3/uL (ref 0.1–1.0)
Monocytes Relative: 7 %
Neutro Abs: 6.2 10*3/uL (ref 1.7–7.7)
Neutrophils Relative %: 65 %
Platelets: 129 10*3/uL — ABNORMAL LOW (ref 150–400)
RBC: 5.71 MIL/uL (ref 4.22–5.81)
RDW: 13.3 % (ref 11.5–15.5)
WBC: 9.4 10*3/uL (ref 4.0–10.5)
nRBC: 0 % (ref 0.0–0.2)

## 2019-04-03 LAB — POCT ACTIVATED CLOTTING TIME: Activated Clotting Time: 577 s

## 2019-04-03 LAB — APTT: aPTT: 30 seconds (ref 24–36)

## 2019-04-03 LAB — TROPONIN I
Troponin I: 1.11 ng/mL (ref <0.03)
Troponin I: 9.58 ng/mL (ref <0.03)
Troponin I: 49.27 ng/mL (ref <0.03)

## 2019-04-03 LAB — SARS CORONAVIRUS 2 BY RT PCR (HOSPITAL ORDER, PERFORMED IN ~~LOC~~ HOSPITAL LAB): SARS Coronavirus 2: NEGATIVE

## 2019-04-03 LAB — HEMOGLOBIN A1C
Hgb A1c MFr Bld: 5.4 % (ref 4.8–5.6)
Mean Plasma Glucose: 108.28 mg/dL

## 2019-04-03 LAB — PROTIME-INR
INR: 1.1 (ref 0.8–1.2)
Prothrombin Time: 14.5 seconds (ref 11.4–15.2)

## 2019-04-03 LAB — LIPID PANEL
Cholesterol: 283 mg/dL — ABNORMAL HIGH (ref 0–200)
HDL: 49 mg/dL (ref >40)
LDL Cholesterol: 189 mg/dL — ABNORMAL HIGH (ref 0–99)
Total CHOL/HDL Ratio: 5.8 RATIO
Triglycerides: 224 mg/dL — ABNORMAL HIGH (ref <150)
VLDL: 45 mg/dL — ABNORMAL HIGH (ref 0–40)

## 2019-04-03 LAB — HEMOGLOBIN AND HEMATOCRIT, BLOOD
HCT: 47.2 % (ref 39.0–52.0)
Hemoglobin: 15.1 g/dL (ref 13.0–17.0)

## 2019-04-03 LAB — GLUCOSE, CAPILLARY: Glucose-Capillary: 103 mg/dL — ABNORMAL HIGH (ref 70–99)

## 2019-04-03 SURGERY — CORONARY/GRAFT ACUTE MI REVASCULARIZATION
Anesthesia: Moderate Sedation

## 2019-04-03 MED ORDER — OXYCODONE HCL 5 MG PO TABS: 5.0000 mg | ORAL_TABLET | ORAL | Status: DC | PRN

## 2019-04-03 MED ORDER — FENTANYL CITRATE (PF) 100 MCG/2ML IJ SOLN
INTRAMUSCULAR | Status: AC
  Filled 2019-04-03: qty 2

## 2019-04-03 MED ORDER — HEPARIN (PORCINE) 25000 UT/250ML-% IV SOLN: 700.0000 [IU]/h | INTRAVENOUS | Status: DC

## 2019-04-03 MED ORDER — ASPIRIN 81 MG PO CHEW: 324.0000 mg | CHEWABLE_TABLET | Freq: Once | ORAL | Status: DC

## 2019-04-03 MED ORDER — HEPARIN (PORCINE) IN NACL 1000-0.9 UT/500ML-% IV SOLN: INTRAVENOUS | Status: DC | PRN

## 2019-04-03 MED ORDER — IOHEXOL 300 MG/ML  SOLN
INTRAMUSCULAR | Status: DC | PRN
  Administered 2019-04-03: 13:00:00 220 mL via INTRA_ARTERIAL

## 2019-04-03 MED ORDER — BIVALIRUDIN BOLUS VIA INFUSION - CUPID
INTRAVENOUS | Status: DC | PRN
  Administered 2019-04-03: 13:00:00 45.75 mg via INTRAVENOUS

## 2019-04-03 MED ORDER — TICAGRELOR 90 MG PO TABS
180.0000 mg | ORAL_TABLET | Freq: Once | ORAL | Status: DC
  Filled 2019-04-03: qty 2

## 2019-04-03 MED ORDER — ACETAMINOPHEN 325 MG PO TABS: 650.0000 mg | ORAL_TABLET | ORAL | Status: DC | PRN

## 2019-04-03 MED ORDER — METOPROLOL TARTRATE 50 MG PO TABS
50.0000 mg | ORAL_TABLET | Freq: Two times a day (BID) | ORAL | Status: DC
  Administered 2019-04-03 – 2019-04-04 (×2): 50 mg via ORAL
  Filled 2019-04-03 (×2): qty 1

## 2019-04-03 MED ORDER — SODIUM CHLORIDE 0.9% FLUSH
3.0000 mL | Freq: Two times a day (BID) | INTRAVENOUS | Status: DC
  Administered 2019-04-03 – 2019-04-05 (×4): 3 mL via INTRAVENOUS

## 2019-04-03 MED ORDER — TICAGRELOR 90 MG PO TABS
90.0000 mg | ORAL_TABLET | Freq: Two times a day (BID) | ORAL | Status: DC
  Administered 2019-04-03 – 2019-04-05 (×4): 90 mg via ORAL
  Filled 2019-04-03 (×4): qty 1

## 2019-04-03 MED ORDER — SODIUM CHLORIDE 0.9 % IV SOLN
INTRAVENOUS | Status: AC | PRN
  Administered 2019-04-03: 13:00:00 1.75 mg/kg/h via INTRAVENOUS

## 2019-04-03 MED ORDER — HEPARIN SODIUM (PORCINE) 5000 UNIT/ML IJ SOLN: 60.0000 [IU]/kg | Freq: Once | INTRAMUSCULAR | Status: DC

## 2019-04-03 MED ORDER — SODIUM CHLORIDE 0.9 % IV SOLN
0.2500 mg/kg/h | INTRAVENOUS | Status: AC
  Administered 2019-04-03: 0.25 mg/kg/h via INTRAVENOUS
  Filled 2019-04-03: qty 250

## 2019-04-03 MED ORDER — DIAZEPAM 2 MG PO TABS: 2.0000 mg | ORAL_TABLET | Freq: Four times a day (QID) | ORAL | Status: DC | PRN

## 2019-04-03 MED ORDER — FENTANYL CITRATE (PF) 100 MCG/2ML IJ SOLN
INTRAMUSCULAR | Status: DC | PRN
  Administered 2019-04-03: 12.5 ug via INTRAVENOUS

## 2019-04-03 MED ORDER — SODIUM CHLORIDE 0.9 % IV SOLN: 250.0000 mL | INTRAVENOUS | Status: DC | PRN

## 2019-04-03 MED ORDER — ALPRAZOLAM 1 MG PO TABS: 1.0000 mg | ORAL_TABLET | Freq: Two times a day (BID) | ORAL | Status: DC | PRN

## 2019-04-03 MED ORDER — SODIUM CHLORIDE 0.9% FLUSH: 3.0000 mL | INTRAVENOUS | Status: DC | PRN

## 2019-04-03 MED ORDER — CLONIDINE HCL 0.1 MG PO TABS: 0.2000 mg | ORAL_TABLET | ORAL | Status: AC | PRN

## 2019-04-03 MED ORDER — ASPIRIN 81 MG PO CHEW
81.0000 mg | CHEWABLE_TABLET | Freq: Every day | ORAL | Status: DC
  Administered 2019-04-04 – 2019-04-05 (×2): 81 mg via ORAL
  Filled 2019-04-03 (×2): qty 1

## 2019-04-03 MED ORDER — ZOLPIDEM TARTRATE 5 MG PO TABS: 5.0000 mg | ORAL_TABLET | Freq: Every evening | ORAL | Status: DC | PRN

## 2019-04-03 MED ORDER — BIVALIRUDIN TRIFLUOROACETATE 250 MG IV SOLR
INTRAVENOUS | Status: AC
  Filled 2019-04-03: qty 250

## 2019-04-03 MED ORDER — SODIUM CHLORIDE 0.9 % IV SOLN: INTRAVENOUS | Status: DC

## 2019-04-03 MED ORDER — SODIUM CHLORIDE 0.9 % WEIGHT BASED INFUSION
1.0000 mL/kg/h | INTRAVENOUS | Status: DC
  Administered 2019-04-03: 15:00:00 1 mL/kg/h via INTRAVENOUS

## 2019-04-03 MED ORDER — HEPARIN SODIUM (PORCINE) 5000 UNIT/ML IJ SOLN
5000.0000 [IU] | Freq: Once | INTRAMUSCULAR | Status: AC
  Administered 2019-04-03: 12:00:00 5000 [IU] via INTRAVENOUS

## 2019-04-03 MED ORDER — TICAGRELOR 90 MG PO TABS
ORAL_TABLET | ORAL | Status: AC
  Filled 2019-04-03: qty 2

## 2019-04-03 MED ORDER — LABETALOL HCL 5 MG/ML IV SOLN
INTRAVENOUS | Status: AC
  Filled 2019-04-03: qty 4

## 2019-04-03 MED ORDER — ATORVASTATIN CALCIUM 80 MG PO TABS
80.0000 mg | ORAL_TABLET | Freq: Every day | ORAL | Status: DC
  Administered 2019-04-03 – 2019-04-04 (×2): 80 mg via ORAL
  Filled 2019-04-03 (×2): qty 1
  Filled 2019-04-03 (×2): qty 4
  Filled 2019-04-03: qty 1

## 2019-04-03 MED ORDER — LABETALOL HCL 5 MG/ML IV SOLN
INTRAVENOUS | Status: DC | PRN
  Administered 2019-04-03 (×2): 10 mg via INTRAVENOUS

## 2019-04-03 MED ORDER — TICAGRELOR 90 MG PO TABS
ORAL_TABLET | ORAL | Status: DC | PRN
  Administered 2019-04-03: 180 mg via ORAL

## 2019-04-03 MED ORDER — AMLODIPINE BESYLATE 5 MG PO TABS
5.0000 mg | ORAL_TABLET | Freq: Two times a day (BID) | ORAL | Status: DC
  Administered 2019-04-03 – 2019-04-04 (×2): 5 mg via ORAL
  Filled 2019-04-03 (×2): qty 1

## 2019-04-03 MED ORDER — ONDANSETRON HCL 4 MG/2ML IJ SOLN: 4.0000 mg | Freq: Four times a day (QID) | INTRAMUSCULAR | Status: DC | PRN

## 2019-04-03 SURGICAL SUPPLY — 17 items
BALLN TREK RX 2.5X15 (BALLOONS) ×3
BALLOON TREK RX 2.5X15 (BALLOONS) ×1 IMPLANT
CATH 5FR PIGTAIL DIAGNOSTIC (CATHETERS) ×3 IMPLANT
CATH INFINITI 5FR JL5 (CATHETERS) ×3 IMPLANT
CATH INFINITI JR4 5F (CATHETERS) ×3 IMPLANT
CATH VISTA GUIDE 6FR XB3.5 (CATHETERS) ×3 IMPLANT
DEVICE CLOSURE MYNXGRIP 6/7F (Vascular Products) ×3 IMPLANT
DEVICE INFLAT 30 PLUS (MISCELLANEOUS) ×3 IMPLANT
FEM STOP ARCH (HEMOSTASIS) ×2
KIT MANI 3VAL PERCEP (MISCELLANEOUS) ×3 IMPLANT
NEEDLE PERC 18GX7CM (NEEDLE) ×3 IMPLANT
PACK CARDIAC CATH (CUSTOM PROCEDURE TRAY) ×3 IMPLANT
SHEATH AVANTI 6FR X 11CM (SHEATH) ×3 IMPLANT
STENT RESOLUTE ONYX 2.5X22 (Permanent Stent) ×3 IMPLANT
SYSTEM COMPRESSION FEMOSTOP (HEMOSTASIS) ×1 IMPLANT
WIRE G HI TQ BMW 190 (WIRE) ×3 IMPLANT
WIRE GUIDERIGHT .035X150 (WIRE) ×3 IMPLANT

## 2019-04-03 NOTE — Progress Notes (Signed)
ANTICOAGULATION CONSULT NOTE - Initial Consult  Pharmacy Consult for Heparin Drip Indication: chest pain/ACS  Allergies  Allergen Reactions  . Ace Inhibitors Anaphylaxis  . Ciprofloxacin Anaphylaxis  . Penicillins Anaphylaxis    Patient Measurements: Height: 5\' 5"  (165.1 cm) Weight: 134 lb 7.7 oz (61 kg) IBW/kg (Calculated) : 61.5 Heparin Dosing Weight: 61 kg  Vital Signs: Temp: 98.4 F (36.9 C) (05/27 1156) Temp Source: Oral (05/27 1156) BP: 172/131 (05/27 1156) Pulse Rate: 72 (05/27 1156)  Labs: Recent Labs    04/03/19 1201  HGB 14.9  HCT 46.9  PLT 129*  APTT 30  LABPROT 14.5  INR 1.1  CREATININE 1.33*  TROPONINI 1.11*    Estimated Creatinine Clearance: 35 mL/min (A) (by C-G formula based on SCr of 1.33 mg/dL (H)).   Medical History: Past Medical History:  Diagnosis Date  . Alcohol abuse   . Hypertension    Assessment: Patient is a 85yo male admitted for chest pain. Pharmacy consulted for Heparin dosing for STEMI/ACS. Patient was given Heparin 5000 units IV once in the ED at the same time the Heparin consult was placed.  Goal of Therapy:  Heparin level 0.3-0.7 units/ml Monitor platelets by anticoagulation protocol: Yes   Plan:  Will order Heparin 700 units/hr IV infusion. Patient was taken emergently to the Cath Lab. Will follow up on continuation of Heparin drip post cath.  Clovia Cuff, PharmD, BCPS 04/03/2019 1:31 PM

## 2019-04-03 NOTE — ED Provider Notes (Addendum)
[facility] Emergency Department Provider Note    First MD Initiated Contact with Patient 04/03/19 1149     (approximate)  I have reviewed the triage vital signs and the nursing notes.   HISTORY  Chief Complaint Chest Pain    HPI Frank Jefferson is a 85 y.o. male Modena JanskyZentz the ER for midsternal chest pain is been intermittent and waxing waning over the past 24 hours.  Patient is calling out for EMS support due to chest pain and currently lives in a assisted living facility or some form of group home.  Currently is having some chest discomfort.  Denies any history of heart disease.  Does have remote history of alcohol use.  Not currently on any blood thinners.  EMS found patient with EKG findings concerning for ST elevation MI.  Was given aspirin as well nitro.    Past Medical History:  Diagnosis Date   Alcohol abuse    Chronic kidney disease    Hypertension    Family History  Problem Relation Age of Onset   Hypertension Mother    Cancer Mother    CAD Mother    Cancer Brother    Past Surgical History:  Procedure Laterality Date   CATARACT EXTRACTION W/PHACO Right 01/17/2018   Procedure: CATARACT EXTRACTION PHACO AND INTRAOCULAR LENS PLACEMENT (IOC) COMPLICATED RIGHT;  Surgeon: Lockie MolaBrasington, Chadwick, MD;  [facility];  Service: Ophthalmology;  Laterality: Right;   CATARACT EXTRACTION W/PHACO Left 03/14/2018   Procedure: CATARACT EXTRACTION PHACO AND INTRAOCULAR LENS PLACEMENT (IOC) COMPLICATED LEFT;  Surgeon: Lockie MolaBrasington, Chadwick, MD;  [facility];  Service: Ophthalmology;  Laterality: Left;   CORONARY/GRAFT ACUTE MI REVASCULARIZATION N/A 04/03/2019   Procedure: Coronary/Graft Acute MI Revascularization;  Surgeon: Alwyn Peaallwood, Dwayne D, MD;  Location: [facility] INVASIVE CV LAB;  Service: Cardiovascular;  Laterality: N/A;   FOOT SURGERY Right    metal plate   LEFT HEART CATH AND CORONARY ANGIOGRAPHY N/A 04/03/2019   Procedure: LEFT HEART CATH AND CORONARY ANGIOGRAPHY;  Surgeon: Alwyn Peaallwood, Dwayne D, MD;  Location: [facility] INVASIVE CV LAB;  Service: Cardiovascular;  Laterality: N/A;   Patient Active Problem List   Diagnosis Date Noted   STEMI (ST elevation myocardial infarction) (HCC) 04/03/2019   AKI (acute kidney injury) (HCC) 08/15/2018      Prior to Admission medications   Medication Sig Start Date End Date Taking? Authorizing Provider  acetaminophen (TYLENOL) 325 MG tablet Take 650 mg by mouth every 4 (four) hours as needed for fever.   Yes [provider]  alum & mag hydroxide-simeth (MINTOX) 200-200-20 MG/5ML suspension Take 30 mLs by mouth daily as needed for indigestion or heartburn.   Yes [provider]  bismuth subsalicylate (PEPTO BISMOL) 262 MG/15ML suspension Take 15 mLs by mouth every 2 (two) hours as needed for indigestion (max 6 doses in 24 hours).   Yes [provider]  dicyclomine (BENTYL) 10 MG capsule Take 10 mg by mouth 4 (four) times daily.   Yes [provider]  folic acid (FOLVITE) 1 MG tablet Take 1 mg by mouth daily.   Yes [provider]  loperamide (IMODIUM) 2 MG capsule Take 2 mg by mouth as needed for diarrhea or loose stools (max 4 doses).   Yes [provider]  LORazepam (ATIVAN) 0.5 MG tablet Take 0.5 mg by mouth 2 (two) times daily as needed for anxiety.   Yes [provider]  magnesium hydroxide (MILK OF MAGNESIA) 400 MG/5ML suspension Take 30  mLs by mouth daily as needed for mild constipation.   Yes [provider]  magnesium oxide (MAG-OX) 400 MG tablet Take 400 mg by mouth 3 (three) times daily.   Yes [provider]  Menthol (HONEY LEMON COUGH DROPS MT) Use as directed 1 tablet in the mouth or throat every 3 (three) hours as needed (cough).   Yes [provider]  Pseudoephedrine-DM-GG (ROBITUSSIN CF PO) Take 10 mLs by mouth every 6 (six) hours as needed (cough/congestion).   Yes  [provider]  psyllium (METAMUCIL) 58.6 % powder Take 1 packet by mouth 2 (two) times a day.   Yes [provider]  Skin Protectants, Misc. (BAZA PROTECT EX) Apply 1 application topically 2 (two) times daily as needed (skin protection).   Yes [provider]  sodium phosphate (FLEET) 7-19 GM/118ML ENEM Place 1 enema rectally daily as needed for severe constipation (not relieved by milk of mag and prune juice).   Yes [provider]  thiamine (VITAMIN B-1) 100 MG tablet Take 100 mg by mouth daily.   Yes [provider]  amLODipine (NORVASC) 5 MG tablet Take 1 tablet (5 mg total) by mouth daily. 04/06/19   Enedina Finner, MD  aspirin 81 MG chewable tablet Chew 1 tablet (81 mg total) by mouth daily. 04/06/19   Enedina Finner, MD  atorvastatin (LIPITOR) 80 MG tablet Take 1 tablet (80 mg total) by mouth daily at 6 PM. 04/05/19   Enedina Finner, MD  metoprolol tartrate (LOPRESSOR) 25 MG tablet Take 1 tablet (25 mg total) by mouth 2 (two) times daily. 04/05/19   Enedina Finner, MD  ticagrelor (BRILINTA) 90 MG TABS tablet Take 1 tablet (90 mg total) by mouth 2 (two) times daily. 04/05/19   Enedina Finner, MD    Allergies Ace inhibitors; Ciprofloxacin; and Penicillins    Social History Social History   Tobacco Use   Smoking status: Never Smoker   Smokeless tobacco: Current User    Types: Snuff  Substance Use Topics   Alcohol use: Yes    Comment: "1 40oz lasts all week"   Drug use: No    Review of Systems Patient denies headaches, rhinorrhea, blurry vision, numbness, shortness of breath, chest pain, edema, cough, abdominal pain, nausea, vomiting, diarrhea, dysuria, fevers, rashes or hallucinations unless otherwise stated above in HPI. ____________________________________________   PHYSICAL EXAM:  VITAL SIGNS: Vitals:   04/05/19 0340 04/05/19 0732  BP: 111/60 131/83  Pulse: 67 67  Resp: 18 16  Temp: 98.5 F (36.9 C) 98.3 F (36.8 C)  SpO2: 100% 100%      Constitutional: Alert and oriented.  Eyes: Conjunctivae are normal.  Head: Atraumatic. Nose: No congestion/rhinnorhea. Mouth/Throat: Mucous membranes are moist.   Neck: No stridor. Painless ROM.  Cardiovascular: Normal rate, regular rhythm. Grossly normal heart sounds.  Good peripheral circulation. Respiratory: Normal respiratory effort.  No retractions. Lungs CTAB. Gastrointestinal: Soft and nontender. No distention. No abdominal bruits. No CVA tenderness. Genitourinary: deferred Musculoskeletal: No lower extremity tenderness nor edema.  No joint effusions. Neurologic:  Normal speech and language. No gross focal neurologic deficits are appreciated. No facial droop Skin:  Skin is warm, dry and intact. No rash noted. Psychiatric: calm and cooperative ____________________________________________   LABS (all labs ordered are listed, but only abnormal results are displayed)  No results found for this or any previous visit (from the past 24 hour(s)). ____________________________________________  EKG My review and personal interpretation at Time: 11:51   Indication: chest pain  Rate: 70  Rhythm: sinus Axis: normal Other: normal intervals.  Inferolateral STEMI ____________________________________________  RADIOLOGY  I personally reviewed all radiographic images ordered to evaluate for the above acute complaints and reviewed radiology reports and findings.  These findings were personally discussed with the patient.  Please see medical record for radiology report.  ____________________________________________   PROCEDURES  Procedure(s) performed:  .Critical Care Performed by: Willy Eddy, MD Authorized by: Willy Eddy, MD   Critical care provider statement:    Critical care time (minutes):  10   Critical care time was exclusive of:  Separately billable procedures and treating other patients   Critical care was necessary to treat or prevent imminent or  life-threatening deterioration of the following conditions:  Cardiac failure   Critical care was time spent personally by me on the following activities:  Development of treatment plan with patient or surrogate, discussions with consultants, evaluation of patient's response to treatment, examination of patient, obtaining history from patient or surrogate, ordering and performing treatments and interventions, ordering and review of laboratory studies, ordering and review of radiographic studies, pulse oximetry, re-evaluation of patient's condition and review of old charts      Critical Care performed: yes ____________________________________________   INITIAL IMPRESSION / ASSESSMENT AND PLAN / ED COURSE  Pertinent labs & imaging results that were available during my care of the patient were reviewed by me and considered in my medical decision making (see chart for details).   DDX: ACS, pericarditis, esophagitis, boerhaaves, pe, dissection, pna, bronchitis, costochondritis   Frank Jefferson is a 85 y.o. who presents to the ED with evidence of STEMI by EKG.  History and presentation somewhat limited by presumed underlying component of dementia but patient able to currently answer questions.  Code STEMI was called.    Clinical Course as of Apr 12 706  Wed Apr 03, 2019  1202 Blood pressure elevated.  Dr. Juliann Pares at bedside.  Plan to take emergently to Cath Lab for PCI.   [PR]    Clinical Course User Index [PR] Willy Eddy, MD    The patient was evaluated in Emergency Department today for the symptoms described in the history of present illness. He/she was evaluated in the context of the global COVID-19 pandemic, which necessitated consideration that the patient might be at risk for infection with the SARS-CoV-2 virus that causes COVID-19. Institutional protocols and algorithms that pertain to the evaluation of patients at risk for COVID-19 are in a state of rapid change based on  information released by regulatory bodies including the CDC and federal and state organizations. These policies and algorithms were followed during the patient's care in the ED.  As part of my medical decision making, I reviewed the following data within the electronic MEDICAL RECORD NUMBER Nursing notes reviewed and incorporated, Labs reviewed, notes from prior ED visits and [place] Controlled Substance Database   ____________________________________________   FINAL CLINICAL IMPRESSION(S) / ED DIAGNOSES  Final diagnoses:  ST elevation myocardial infarction (STEMI), unspecified artery (HCC)      NEW MEDICATIONS STARTED DURING THIS VISIT:  Discharge Medication List as of 04/05/2019  3:16 PM    START taking these medications   Details  amLODipine (NORVASC) 5 MG tablet Take 1 tablet (5 mg total) by mouth daily., Starting Sat 04/06/2019, Normal    aspirin 81 MG chewable tablet Chew 1 tablet (81 mg total) by mouth daily., Starting Sat 04/06/2019, Normal    atorvastatin (LIPITOR) 80 MG tablet Take 1 tablet (80 mg total)  by mouth daily at 6 PM., Starting Fri 04/05/2019, Normal    metoprolol tartrate (LOPRESSOR) 25 MG tablet Take 1 tablet (25 mg total) by mouth 2 (two) times daily., Starting Fri 04/05/2019, Normal    ticagrelor (BRILINTA) 90 MG TABS tablet Take 1 tablet (90 mg total) by mouth 2 (two) times daily., Starting Fri 04/05/2019, Normal         Note:  This document was prepared using Dragon voice recognition software and may include unintentional dictation errors.    Willy Eddy, MD 04/03/19 1402    Willy Eddy, MD 04/13/19 [phone]

## 2019-04-03 NOTE — H&P (Signed)
[facility] [employer] - [place] at [facility]   PATIENT NAME: Frank Jefferson    MR#:  [id number]  DATE OF BIRTH:  [birth year]  DATE OF ADMISSION:  04/03/2019  PRIMARY CARE PHYSICIAN: Hyman Hopes, MD   REQUESTING/REFERRING PHYSICIAN: Callrwood  CHIEF COMPLAINT:   Chief Complaint  Patient presents with  . Chest Pain    HISTORY OF PRESENT ILLNESS: Frank Jefferson  is a 85 y.o. male with a known history of alcohol abuse, chronic kidney disease, hypertension-lives at home and had intermittent chest pain for last few days but today the pain was more severe so came to emergency room and noted to have elevated troponin and ST elevation MI.  He was taken directly to Cath Lab and a stent is placed in circumflex artery.  He was sent to stepdown unit for further monitoring and hospitalist service was called in for admission.  PAST MEDICAL HISTORY:   Past Medical History:  Diagnosis Date  . Alcohol abuse   . Chronic kidney disease   . Hypertension     PAST SURGICAL HISTORY:  Past Surgical History:  Procedure Laterality Date  . CATARACT EXTRACTION W/PHACO Right 01/17/2018   Procedure: CATARACT EXTRACTION PHACO AND INTRAOCULAR LENS PLACEMENT (IOC) COMPLICATED RIGHT;  Surgeon: Lockie Mola, MD;  Location: [facility];  Service: Ophthalmology;  Laterality: Right;  . CATARACT EXTRACTION W/PHACO Left 03/14/2018   Procedure: CATARACT EXTRACTION PHACO AND INTRAOCULAR LENS PLACEMENT (IOC) COMPLICATED LEFT;  Surgeon: Lockie Mola, MD;  Location: [facility];  Service: Ophthalmology;  Laterality: Left;  . CORONARY/GRAFT ACUTE MI REVASCULARIZATION N/A 04/03/2019   Procedure: Coronary/Graft Acute MI Revascularization;  Surgeon: Alwyn Pea, MD;  Location: [facility] INVASIVE CV LAB;  Service: Cardiovascular;  Laterality: N/A;  . FOOT SURGERY Right    metal plate  . LEFT HEART CATH AND CORONARY ANGIOGRAPHY N/A 04/03/2019   Procedure: LEFT HEART CATH AND CORONARY  ANGIOGRAPHY;  Surgeon: Alwyn Pea, MD;  Location: [facility] INVASIVE CV LAB;  Service: Cardiovascular;  Laterality: N/A;    SOCIAL HISTORY:  Social History   Tobacco Use  . Smoking status: Never Smoker  . Smokeless tobacco: Current User    Types: Snuff  Substance Use Topics  . Alcohol use: Yes    Comment: "1 40oz lasts all week"    FAMILY HISTORY:  Family History  Problem Relation Age of Onset  . Hypertension Mother   . Cancer Mother   . CAD Mother   . Cancer Brother     DRUG ALLERGIES:  Allergies  Allergen Reactions  . Ace Inhibitors Anaphylaxis  . Ciprofloxacin Anaphylaxis  . Penicillins Anaphylaxis    REVIEW OF SYSTEMS:   CONSTITUTIONAL: No fever, fatigue or weakness.  EYES: No blurred or double vision.  EARS, NOSE, AND THROAT: No tinnitus or ear pain.  RESPIRATORY: No cough, shortness of breath, wheezing or hemoptysis.  CARDIOVASCULAR: He had chest pain, no orthopnea, edema.  GASTROINTESTINAL: No nausea, vomiting, diarrhea or abdominal pain.  GENITOURINARY: No dysuria, hematuria.  ENDOCRINE: No polyuria, nocturia,  HEMATOLOGY: No anemia, easy bruising or bleeding SKIN: No rash or lesion. MUSCULOSKELETAL: No joint pain or arthritis.   NEUROLOGIC: No tingling, numbness, weakness.  PSYCHIATRY: No anxiety or depression.   MEDICATIONS AT HOME:  Prior to Admission medications   Medication Sig Start Date End Date Taking? Authorizing Provider  carvedilol (COREG) 12.5 MG tablet Take 12.5 mg by mouth 2 (two) times a day.   Yes [provider]  dicyclomine (BENTYL) 10 MG  capsule Take 10 mg by mouth 4 (four) times daily.   Yes [provider]  folic acid (FOLVITE) 1 MG tablet Take 1 mg by mouth daily.   Yes [provider]  magnesium oxide (MAG-OX) 400 MG tablet Take 400 mg by mouth 3 (three) times daily.   Yes [provider]  amLODipine (NORVASC) 10 MG tablet Take 10 mg by mouth daily.    [provider]  tamsulosin  (FLOMAX) 0.4 MG CAPS capsule Take 1 capsule (0.4 mg total) by mouth daily. 02/26/18   Alfredo Martinez, MD  thiamine (VITAMIN B-1) 100 MG tablet Take 100 mg by mouth daily.    [provider]      PHYSICAL EXAMINATION:   VITAL SIGNS: Blood pressure (!) 197/147, pulse 72, temperature (!) 97 F (36.1 C), temperature source Axillary, resp. rate 18, height  (1.88 m), weight 64.5 kg, SpO2 98 %.  GENERAL:  85 y.o.-year-old patient lying in the bed with no acute distress.  EYES: Pupils equal, round, reactive to light and accommodation. No scleral icterus. Extraocular muscles intact.  HEENT: Head atraumatic, normocephalic. Oropharynx and nasopharynx clear.  NECK:  Supple, no jugular venous distention. No thyroid enlargement, no tenderness.  LUNGS: Normal breath sounds bilaterally, no wheezing, rales,rhonchi or crepitation. No use of accessory muscles of respiration.  CARDIOVASCULAR: S1, S2 normal. No murmurs, rubs, or gallops.  ABDOMEN: Soft, nontender, nondistended. Bowel sounds present. No organomegaly or mass.  EXTREMITIES: No pedal edema, cyanosis, or clubbing.  NEUROLOGIC: Cranial nerves II through XII are intact. Muscle strength 4/5 in all extremities. Sensation intact. Gait not checked.  PSYCHIATRIC: The patient is alert and oriented x 3.  SKIN: No obvious rash, lesion, or ulcer.   LABORATORY PANEL:   CBC Recent Labs  Lab 04/03/19 1201  WBC 9.4  HGB 14.9  HCT 46.9  PLT 129*  MCV 82.1  MCH 26.1  MCHC 31.8  RDW 13.3  LYMPHSABS 2.3  MONOABS 0.6  EOSABS 0.3  BASOSABS 0.0   ------------------------------------------------------------------------------------------------------------------  Chemistries  Recent Labs  Lab 04/03/19 1201  NA 140  K 4.3  CL 102  CO2 28  GLUCOSE 112*  BUN 15  CREATININE 1.33*  CALCIUM 8.9  AST 28  ALT 10  ALKPHOS 88  BILITOT 1.0    ------------------------------------------------------------------------------------------------------------------ estimated creatinine clearance is 37 mL/min (A) (by C-G formula based on SCr of 1.33 mg/dL (H)). ------------------------------------------------------------------------------------------------------------------ No results for input(s): TSH, T4TOTAL, T3FREE, THYROIDAB in the last 72 hours.  Invalid input(s): FREET3   Coagulation profile Recent Labs  Lab 04/03/19 1201  INR 1.1   ------------------------------------------------------------------------------------------------------------------- No results for input(s): DDIMER in the last 72 hours. -------------------------------------------------------------------------------------------------------------------  Cardiac Enzymes Recent Labs  Lab 04/03/19 1201  TROPONINI 1.11*   ------------------------------------------------------------------------------------------------------------------ Invalid input(s): POCBNP  ---------------------------------------------------------------------------------------------------------------  Urinalysis    Component Value Date/Time   COLORURINE AMBER (A) 08/15/2018 0742   APPEARANCEUR CLEAR (A) 08/15/2018 0742   APPEARANCEUR Cloudy (A) 02/26/2018 1448   LABSPEC 1.017 08/15/2018 0742   LABSPEC 1.012 04/23/2014 1606   PHURINE 5.0 08/15/2018 0742   GLUCOSEU NEGATIVE 08/15/2018 0742   GLUCOSEU Negative 04/23/2014 1606   HGBUR SMALL (A) 08/15/2018 0742   BILIRUBINUR NEGATIVE 08/15/2018 0742   BILIRUBINUR Negative 02/26/2018 1448   BILIRUBINUR Negative 04/23/2014 1606   KETONESUR NEGATIVE 08/15/2018 0742   PROTEINUR 30 (A) 08/15/2018 0742   NITRITE NEGATIVE 08/15/2018 0742   LEUKOCYTESUR NEGATIVE 08/15/2018 0742   LEUKOCYTESUR Negative 02/26/2018 1448   LEUKOCYTESUR Negative 04/23/2014 1606  RADIOLOGY: Dg Chest Port 1 View  Result Date: 04/03/2019 CLINICAL DATA:   Chest pain EXAM: PORTABLE CHEST 1 VIEW COMPARISON:  No 08/15/2018 FINDINGS: Normal heart size. Aortic atherosclerosis. No pleural effusion. Pulmonary vascular congestion without frank edema. Decreased lung volumes with bibasilar atelectasis. IMPRESSION: 1. Low lung volumes with bibasilar atelectasis and pulmonary vascular congestion. 2.  Aortic Atherosclerosis (ICD10-I70.0). Electronically Signed   By: Signa Kellaylor  Stroud M.D.   On: 04/03/2019 12:16    EKG: Orders placed or performed during the hospital encounter of 04/03/19  . ED EKG  . ED EKG  . EKG 12-Lead immediately post procedure  . EKG 12-Lead  . EKG 12-Lead immediately post procedure    IMPRESSION AND PLAN:  *Acute ST elevation MI Cardiac cath is done and drug-eluting stent is placed. Cardiology suggested aspirin and Brilinta. Also started on metoprolol and atorvastatin. Patient has allergy to ACE inhibitors. His LDL level is elevated.  Check hemoglobin A1c also. Check echocardiogram and follow cardiology recommendations.  *Uncontrolled hypertension Metoprolol and amlodipine and continue to monitor.  *Hyperlipidemia Atorvastatin.  *Chronic kidney disease Avoid nephrotoxic agents and continue to monitor renal function.   All the records are reviewed and case discussed with ED provider. Management plans discussed with the patient, family and they are in agreement.  CODE STATUS: Full code.    Code Status Orders  (From admission, onward)         Start     Ordered   04/03/19 1426  Full code  Continuous     04/03/19 1425        Code Status History    Date Active Date Inactive Code Status Order ID Comments User Context   08/15/2018 1535 08/16/2018 1508 Full Code [id number]  Ramonita LabGouru, Aruna, MD Inpatient       TOTAL TIME TAKING CARE OF THIS PATIENT: 50 minutes.    Altamese DillingVaibhavkumar Adin Laker M.D on 04/03/2019   Between 7am to 6pm - Pager - [phone]  After 6pm go to [email or web address] - password [email or web address]  [facility]  Office  [phone]  CC: Primary care physician; Hyman HopesBurns, Harriett P, MD   Note: This dictation was prepared with Dragon dictation along with smaller phrase technology. Any transcriptional errors that result from this process are unintentional.

## 2019-04-03 NOTE — Consult Note (Signed)
Name: Frank Jefferson MRN: [id number] DOB: 1931/07/24    ADMISSION DATE:  04/03/2019 CONSULTATION DATE: 04/03/2019  REFERRING MD : Dr. Elisabeth Pigeon  CHIEF COMPLAINT: Chest Pain   BRIEF PATIENT DESCRIPTION:  85 yo male admitted with inferolateral STEMI secondary to 100% stenosis of mid Cx s/p successful PCI and stent placement   SIGNIFICANT EVENTS/STUDIES:  05/27-Pt admitted to ICU post cardiac catheterization  05/27-Cardiac catheterization revealed Mid RCA lesion is 25% stenosed. Mid Cx lesion is 100% stenosed. Infarct-related artery Mild reduced left ventricular function inferior posterior hypokinesis ejection fraction of 45%  HISTORY OF PRESENT ILLNESS:   This is an 85 yo male with a PMH of HTN, Anemia, ETOH Abuse, Chronic Generalized Abdominal Pain, and CKD.  He presented to [facility] ER on 05/27 from [facility] via EMS with intermittent chest pain, onset of symptoms 2 days prior to presentation.  The morning of 05/27 the chest pain persisted prompting ER visit.  Upon EMS arrival EKG concerning for possible STEMI, therefore he received aspirin and sublingual nitroglycerin. In the ER EKG revealed inferolateral STEMI, code STEMI activated.  Following evaluation by Cardiologist Dr. Juliann Pares pt transported for emergent cardiac cath.  Cardiac Cath revealed 100% stenosis of mid Cx requiring drug eluting stent.  The pt was subsequently transferred to ICU post procedure for additional workup and treatment.   PAST MEDICAL HISTORY :   has a past medical history of Alcohol abuse, Chronic kidney disease, and Hypertension.  has a past surgical history that includes Foot surgery (Right); Cataract extraction w/PHACO (Right, 01/17/2018); Cataract extraction w/PHACO (Left, 03/14/2018); Coronary/Graft Acute MI Revascularization (N/A, 04/03/2019); and LEFT HEART CATH AND CORONARY ANGIOGRAPHY (N/A, 04/03/2019). Prior to Admission medications   Medication Sig Start Date End Date Taking? Authorizing  Provider  acetaminophen (TYLENOL) 325 MG tablet Take 650 mg by mouth every 4 (four) hours as needed for fever.   Yes [provider]  acetaminophen (TYLENOL) 500 MG tablet Take 500 mg by mouth every 6 (six) hours as needed for mild pain or moderate pain.   Yes [provider]  alum & mag hydroxide-simeth (MINTOX) 200-200-20 MG/5ML suspension Take 30 mLs by mouth daily as needed for indigestion or heartburn.   Yes [provider]  bismuth subsalicylate (PEPTO BISMOL) 262 MG/15ML suspension Take 15 mLs by mouth every 2 (two) hours as needed for indigestion (max 6 doses in 24 hours).   Yes [provider]  carvedilol (COREG) 12.5 MG tablet Take 12.5 mg by mouth 2 (two) times a day.   Yes [provider]  dicyclomine (BENTYL) 10 MG capsule Take 10 mg by mouth 4 (four) times daily.   Yes [provider]  folic acid (FOLVITE) 1 MG tablet Take 1 mg by mouth daily.   Yes [provider]  loperamide (IMODIUM) 2 MG capsule Take 2 mg by mouth as needed for diarrhea or loose stools (max 4 doses).   Yes [provider]  LORazepam (ATIVAN) 0.5 MG tablet Take 0.5 mg by mouth 2 (two) times daily as needed for anxiety.   Yes [provider]  magnesium hydroxide (MILK OF MAGNESIA) 400 MG/5ML suspension Take 30 mLs by mouth daily as needed for mild constipation.   Yes [provider]  magnesium oxide (MAG-OX) 400 MG tablet Take 400 mg by mouth 3 (three) times daily.   Yes [provider]  Menthol (HONEY LEMON COUGH DROPS MT) Use as directed 1 tablet in the mouth or throat every 3 (three)  hours as needed (cough).   Yes [provider]  Pseudoephedrine-DM-GG (ROBITUSSIN CF PO) Take 10 mLs by mouth every 6 (six) hours as needed (cough/congestion).   Yes [provider]  psyllium (METAMUCIL) 58.6 % powder Take 1 packet by mouth 2 (two) times a day.   Yes [provider]  Skin Protectants, Misc. (BAZA  PROTECT EX) Apply 1 application topically 2 (two) times daily as needed (skin protection).   Yes [provider]  sodium phosphate (FLEET) 7-19 GM/118ML ENEM Place 1 enema rectally daily as needed for severe constipation (not relieved by milk of mag and prune juice).   Yes [provider]  thiamine (VITAMIN B-1) 100 MG tablet Take 100 mg by mouth daily.   Yes [provider]  tamsulosin (FLOMAX) 0.4 MG CAPS capsule Take 1 capsule (0.4 mg total) by mouth daily. Patient not taking: Reported on 04/03/2019 02/26/18   Alfredo Martinez, MD   Allergies  Allergen Reactions  . Ace Inhibitors Anaphylaxis  . Ciprofloxacin Anaphylaxis  . Penicillins Anaphylaxis    FAMILY HISTORY:  family history includes CAD in his mother; Cancer in his brother and mother; Hypertension in his mother. SOCIAL HISTORY:  reports that he has never smoked. His smokeless tobacco use includes snuff. He reports current alcohol use. He reports that he does not use drugs.  REVIEW OF SYSTEMS: Positives in BOLD   Constitutional: Negative for fever, chills, weight loss, malaise/fatigue and diaphoresis.  HENT: Negative for hearing loss, ear pain, nosebleeds, congestion, sore throat, neck pain, tinnitus and ear discharge.   Eyes: Negative for blurred vision, double vision, photophobia, pain, discharge and redness.  Respiratory: Negative for cough, hemoptysis, sputum production, shortness of breath, wheezing and stridor.   Cardiovascular: chest pain, palpitations, orthopnea, claudication, leg swelling and PND.  Gastrointestinal: Negative for heartburn, nausea, vomiting, abdominal pain, diarrhea, constipation, blood in stool and melena.  Genitourinary: Negative for dysuria, urgency, frequency, hematuria and flank pain.  Musculoskeletal: Negative for myalgias, back pain, joint pain and falls.  Skin: Negative for itching and rash.  Neurological: Negative for dizziness, tingling, tremors, sensory change, speech  change, focal weakness, seizures, loss of consciousness, weakness and headaches.  Endo/Heme/Allergies: Negative for environmental allergies and polydipsia. Does not bruise/bleed easily.  SUBJECTIVE:  No complaints at this time  VITAL SIGNS: Temp:  [97 F (36.1 C)-98.4 F (36.9 C)] 97 F (36.1 C) (05/27 1428) Pulse Rate:  [72] 72 (05/27 1428) Resp:  [18-20] 18 (05/27 1428) BP: (172-197)/(131-147) 197/147 (05/27 1428) SpO2:  [98 %] 98 % (05/27 1428) Weight:  [61 kg-64.5 kg] 64.5 kg (05/27 1428)  PHYSICAL EXAMINATION: General: well developed, well nourished male, NAD  Neuro: alert confused to time and situation, follows commands  HEENT: supple, no JVD  Cardiovascular: sinus rhythm with depressed T wave, no R/G  Lungs: clear throughout, even, non labored  Abdomen: +BS x4, soft, obese, non tender, non distended  Musculoskeletal: normal bulk and tone, no edema  Skin: right groin hematoma present femstop in place no signs of active bleeding at this time  Recent Labs  Lab 04/03/19 1201  NA 140  K 4.3  CL 102  CO2 28  BUN 15  CREATININE 1.33*  GLUCOSE 112*   Recent Labs  Lab 04/03/19 1201  HGB 14.9  HCT 46.9  WBC 9.4  PLT 129*   Dg Chest Port 1 View  Result Date: 04/03/2019 CLINICAL DATA:  Chest pain EXAM: PORTABLE CHEST 1 VIEW COMPARISON:  No 08/15/2018 FINDINGS: Normal heart size.  Aortic atherosclerosis. No pleural effusion. Pulmonary vascular congestion without frank edema. Decreased lung volumes with bibasilar atelectasis. IMPRESSION: 1. Low lung volumes with bibasilar atelectasis and pulmonary vascular congestion. 2.  Aortic Atherosclerosis (ICD10-I70.0). Electronically Signed   By: Signa Kellaylor  Stroud M.D.   On: 04/03/2019 12:16    ASSESSMENT / PLAN:  Inferolateral STEMI secondary to 100% stenosis of mid Cx lesion s/p successful PCI and stent placement-04/03/2019 HTN  Continuous telemetry monitoring  Trend troponin's Echo pending  Cardiology consulted appreciate  input-cardiac medications per recommendations  Continue Angiomax per Cardiology recommendations  Prn oxycodone for pain management    Acute on chronic renal failure  Trend BMP Replace electrolytes as indicated  Monitor UOP Avoid nephrotoxic medications  Continue NS @60  ml/hr per post cath protocol   -Updated pts brother Clemmie Krillheodore Deblanc via telephone regarding plan of care and all questions were answered.  Sonda Rumbleana Azizah Lisle, AGNP  Pulmonary/Critical Care Pager [phone] (please enter 7 digits) PCCM Consult Pager [phone] (please enter 7 digits)

## 2019-04-03 NOTE — ED Notes (Signed)
Cath lab contacted, table not ready yet

## 2019-04-03 NOTE — Progress Notes (Signed)
Family Meeting Note  Advance Directive:no  Today a meeting took place with the Patient.   The following clinical team members were present during this meeting:MD  The following were discussed:Patient's diagnosis: Coronary artery disease, hypertension, hyperlipidemia, Patient's progosis: Unable to determine and Goals for treatment: Full Code  Patient clarified that in any adverse event he would like to be a full code and he only have his brother in close family members so he would like him to be making medical decisions for him if he is not able to make himself.  Additional follow-up to be provided: Cardiology  Time spent during discussion:20 minutes  Altamese Dilling, MD

## 2019-04-03 NOTE — ED Notes (Signed)
Pharmacy called for brilinta

## 2019-04-03 NOTE — Progress Notes (Signed)
Assisted tele visit to patient with family member.  Sequoia Mincey Anderson, RN   

## 2019-04-03 NOTE — ED Triage Notes (Addendum)
Pt from [facility] with reports of chest pain off and on for 2 days became constant this am. Pt arrives with stemi activation. 324mg  ASA given PTA along with 1 spray NTG

## 2019-04-03 NOTE — ED Notes (Signed)
Dr. Callwood at bedside. °

## 2019-04-04 ENCOUNTER — Inpatient Hospital Stay
Admit: 2019-04-04 | Discharge: 2019-04-04 | Disposition: A | Discharge status: Home / Self Care | Payer: Medicare Other | Attending: Internal Medicine | Admitting: Internal Medicine

## 2019-04-04 LAB — CBC WITH DIFFERENTIAL/PLATELET
Abs Immature Granulocytes: 0.05 10*3/uL (ref 0.00–0.07)
Basophils Absolute: 0.1 10*3/uL (ref 0.0–0.1)
Basophils Relative: 1 %
Eosinophils Absolute: 0.2 10*3/uL (ref 0.0–0.5)
Eosinophils Relative: 1 %
HCT: 44.8 % (ref 39.0–52.0)
Hemoglobin: 14.2 g/dL (ref 13.0–17.0)
Immature Granulocytes: 1 %
Lymphocytes Relative: 18 %
Lymphs Abs: 2 10*3/uL (ref 0.7–4.0)
MCH: 25.8 pg — ABNORMAL LOW (ref 26.0–34.0)
MCHC: 31.7 g/dL (ref 30.0–36.0)
MCV: 81.5 fL (ref 80.0–100.0)
Monocytes Absolute: 1.1 10*3/uL — ABNORMAL HIGH (ref 0.1–1.0)
Monocytes Relative: 10 %
Neutro Abs: 7.8 10*3/uL — ABNORMAL HIGH (ref 1.7–7.7)
Neutrophils Relative %: 69 %
Platelets: 131 10*3/uL — ABNORMAL LOW (ref 150–400)
RBC: 5.5 MIL/uL (ref 4.22–5.81)
RDW: 13.4 % (ref 11.5–15.5)
WBC: 11.1 10*3/uL — ABNORMAL HIGH (ref 4.0–10.5)
nRBC: 0 % (ref 0.0–0.2)

## 2019-04-04 LAB — BASIC METABOLIC PANEL
Anion gap: 8 (ref 5–15)
BUN: 16 mg/dL (ref 8–23)
CO2: 23 mmol/L (ref 22–32)
Calcium: 8.6 mg/dL — ABNORMAL LOW (ref 8.9–10.3)
Chloride: 107 mmol/L (ref 98–111)
Creatinine, Ser: 1.25 mg/dL — ABNORMAL HIGH (ref 0.61–1.24)
GFR calc Af Amer: >60 mL/min (ref >60)
GFR calc non Af Amer: 52 mL/min — ABNORMAL LOW (ref >60)
Glucose, Bld: 109 mg/dL — ABNORMAL HIGH (ref 70–99)
Potassium: 4 mmol/L (ref 3.5–5.1)
Sodium: 138 mmol/L (ref 135–145)

## 2019-04-04 LAB — MRSA PCR SCREENING: MRSA by PCR: NEGATIVE

## 2019-04-04 LAB — ECHOCARDIOGRAM COMPLETE
Height: 74 in
Weight: 2275.15 oz

## 2019-04-04 LAB — TROPONIN I: Troponin I: 41.21 ng/mL (ref <0.03)

## 2019-04-04 MED ORDER — THIAMINE HCL 100 MG/ML IJ SOLN
100.0000 mg | Freq: Every day | INTRAMUSCULAR | Status: DC
  Administered 2019-04-04 – 2019-04-05 (×2): 100 mg via INTRAVENOUS
  Filled 2019-04-04 (×2): qty 2

## 2019-04-04 MED ORDER — SODIUM CHLORIDE 0.9 % IV SOLN
1.0000 mg | Freq: Once | INTRAVENOUS | Status: AC
  Administered 2019-04-04: 08:00:00 1 mg via INTRAVENOUS
  Filled 2019-04-04: qty 0.2

## 2019-04-04 MED ORDER — ENSURE ENLIVE PO LIQD
237.0000 mL | Freq: Two times a day (BID) | ORAL | Status: DC
  Administered 2019-04-04: 13:00:00 237 mL via ORAL

## 2019-04-04 MED ORDER — ADULT MULTIVITAMIN W/MINERALS CH
1.0000 | ORAL_TABLET | Freq: Every day | ORAL | Status: DC
  Administered 2019-04-05: 09:00:00 1 via ORAL
  Filled 2019-04-04: qty 1

## 2019-04-04 MED ORDER — AMLODIPINE BESYLATE 5 MG PO TABS
5.0000 mg | ORAL_TABLET | Freq: Every day | ORAL | Status: DC
  Administered 2019-04-05: 09:00:00 5 mg via ORAL
  Filled 2019-04-04: qty 1

## 2019-04-04 MED ORDER — METOPROLOL TARTRATE 25 MG PO TABS
25.0000 mg | ORAL_TABLET | Freq: Two times a day (BID) | ORAL | Status: DC
  Administered 2019-04-04 – 2019-04-05 (×2): 25 mg via ORAL
  Filled 2019-04-04 (×2): qty 1

## 2019-04-04 NOTE — Progress Notes (Signed)
Cardiac Rehab Navigator/ Exercise Physiologist Note   "Heart Attack Bouncing Back" booklet given and reviewed with patient. This EP discussed the definition of CAD. This EP reviewed the location of CAD and where his stent was placed. This EP informed patient he will be given a stent card. This EP explained the purpose of the stent card. Instructed patient to keep stent card in his wallet.  ? This EP discussed modifiable risk factors including controlling blood pressure, cholesterol, and blood sugar; following heart healthy diet; maintaining healthy weight; exercise; and smoking cessation, (not applicable).   ? This EP discussed cardiac medications including rationale for taking, mechanisms of action, and side effects. This EP stressed the importance of taking medications as prescribed. Patient reports that the caregiver/lady that helps at the home where he lives helps him to take his medications the correct way. ? This EP discussed emergency plan for heart attack symptoms. Patient verbalized understanding of need to call 911 and not to drive himself to ER if having cardiac symptoms / chest pain.   ? Diet of low sodium, low fat, low cholesterol heart healthy discussed. Information on diet provided. Patient may have some barriers in food selection choices due to living situation and having meals prepared for him. ? Smoking Cessation - Patient is a FORMER smoker.  ? Exercise - This EP discussed the benefits of exercised discussed Patient likes to walk. Patient uses a cane. Patient walks around the yard.  Informed patient his cardiologist has referred his to outpatient Cardiac Rehab. An overview of the program was provided. Brochure, informational letter, class and orientation times, and CPT billing codes given to patient. Patient is interested in participating. Patient plans to check with his insurance company to see what his out-of-pocket expenses will be.   Patient informed the Cardiac Rehab department  is currently closed due to the COVID-19 pandemic.  The Cardiac Rehab dept will contact patient as soon as the department reopens.  Transportation may be an issue for patient due to living situation. Patient granted verbal permission to speak to his caregiver at home to see if they would provide transportation for him to attend the program.     Patient appreciative of the information.   Nelson Chimes, BS [place]  [facility] Cardiac & Pulmonary Rehab  Exercise Physiologist Department Phone #: [phone] Fax: [phone]  Direct Line [phone] Email Address: [email or web address]

## 2019-04-04 NOTE — Progress Notes (Signed)
CRITICAL CARE NOTE       SUBJECTIVE FINDINGS & SIGNIFICANT EVENTS    Resting in bed comfortably, no new complaints.  Overnight no chest pain or discomfort, remains hemodynamically stable.  Femoral left heart cath site without bleeding/oozing.   PAST MEDICAL HISTORY   Past Medical History:  Diagnosis Date   Alcohol abuse    Chronic kidney disease    Hypertension      SURGICAL HISTORY   Past Surgical History:  Procedure Laterality Date   CATARACT EXTRACTION W/PHACO Right 01/17/2018   Procedure: CATARACT EXTRACTION PHACO AND INTRAOCULAR LENS PLACEMENT (IOC) COMPLICATED RIGHT;  Surgeon: Lockie Mola, MD;  Location: [facility];  Service: Ophthalmology;  Laterality: Right;   CATARACT EXTRACTION W/PHACO Left 03/14/2018   Procedure: CATARACT EXTRACTION PHACO AND INTRAOCULAR LENS PLACEMENT (IOC) COMPLICATED LEFT;  Surgeon: Lockie Mola, MD;  Location: [facility];  Service: Ophthalmology;  Laterality: Left;   CORONARY/GRAFT ACUTE MI REVASCULARIZATION N/A 04/03/2019   Procedure: Coronary/Graft Acute MI Revascularization;  Surgeon: Alwyn Pea, MD;  Location: [facility] INVASIVE CV LAB;  Service: Cardiovascular;  Laterality: N/A;   FOOT SURGERY Right    metal plate   LEFT HEART CATH AND CORONARY ANGIOGRAPHY N/A 04/03/2019   Procedure: LEFT HEART CATH AND CORONARY ANGIOGRAPHY;  Surgeon: Alwyn Pea, MD;  Location: [facility] INVASIVE CV LAB;  Service: Cardiovascular;  Laterality: N/A;     FAMILY HISTORY   Family History  Problem Relation Age of Onset   Hypertension Mother    Cancer Mother    CAD Mother    Cancer Brother      SOCIAL HISTORY   Social History   Tobacco Use   Smoking status: Never Smoker   Smokeless tobacco: Current User    Types: Snuff    Substance Use Topics   Alcohol use: Yes    Comment: "1 40oz lasts all week"   Drug use: No     MEDICATIONS   Current Medication:  Current Facility-Administered Medications:    0.9 %  sodium chloride infusion, , Intravenous, Continuous, Willy Eddy, MD   0.9 %  sodium chloride infusion, 250 mL, Intravenous, PRN, Callwood, Dwayne D, MD   acetaminophen (TYLENOL) tablet 650 mg, 650 mg, Oral, Q4H PRN, Callwood, Dwayne D, MD   ALPRAZolam (XANAX) tablet 1 mg, 1 mg, Oral, BID PRN, Callwood, Dwayne D, MD   amLODipine (NORVASC) tablet 5 mg, 5 mg, Oral, BID, Callwood, Dwayne D, MD, 5 mg at 04/03/19 2147   aspirin chewable tablet 324 mg, 324 mg, Oral, Once, Willy Eddy, MD   aspirin chewable tablet 81 mg, 81 mg, Oral, Daily, Callwood, Dwayne D, MD   atorvastatin (LIPITOR) tablet 80 mg, 80 mg, Oral, q1800, Callwood, Dwayne D, MD, 80 mg at 04/03/19 1748   diazepam (VALIUM) tablet 2 mg, 2 mg, Oral, Q6H PRN, Callwood, Dwayne D, MD   metoprolol tartrate (LOPRESSOR) tablet 50 mg, 50 mg, Oral, BID, Callwood, Dwayne D, MD, 50 mg at 04/03/19 2147   ondansetron (ZOFRAN) injection 4 mg, 4 mg, Intravenous, Q6H PRN, Callwood, Dwayne D, MD   oxyCODONE (Oxy IR/ROXICODONE) immediate release tablet 5-10 mg, 5-10 mg, Oral, Q4H PRN, Callwood, Dwayne D, MD   sodium chloride flush (NS) 0.9 % injection 3 mL, 3 mL, Intravenous, Q12H, Callwood, Dwayne D, MD, 3 mL at 04/03/19 2157   sodium chloride flush (NS) 0.9 % injection 3 mL, 3 mL, Intravenous, PRN, Callwood, Dwayne D, MD   ticagrelor (BRILINTA) tablet 180 mg, 180 mg, Oral,  Once, Willy Eddyobinson, Patrick, MD   ticagrelor [facility](BRILINTA) tablet 90 mg, 90 mg, Oral, BID, Callwood, Dwayne D, MD, 90 mg at 04/03/19 2147   zolpidem (AMBIEN) tablet 5 mg, 5 mg, Oral, QHS PRN, Callwood, Dwayne D, MD    ALLERGIES   Ace inhibitors; Ciprofloxacin; and Penicillins    REVIEW OF SYSTEMS    10 point ROS conducted is negative except as per subjective  findings  PHYSICAL EXAMINATION   Vitals:   04/04/19 0300 04/04/19 0400  BP: 125/88 118/72  Pulse: 61 61  Resp: 13 12  Temp:  98.5 F (36.9 C)  SpO2: 99% 98%    GENERAL: No apparent distress HEAD: Normocephalic, atraumatic.  EYES: Pupils equal, round, reactive to light.  No scleral icterus.  MOUTH: Moist mucosal membrane. NECK: Supple. No thyromegaly. No nodules. No JVD.  PULMONARY: Mild rhonchorous breath sounds CARDIOVASCULAR: S1 and S2. Regular rate and rhythm. No murmurs, rubs, or gallops.  GASTROINTESTINAL: Soft, nontender, non-distended. No masses. Positive bowel sounds. No hepatosplenomegaly.  MUSCULOSKELETAL: No swelling, clubbing, or edema.  NEUROLOGIC: Mild distress due to acute illness SKIN:intact,warm,dry   LABS AND IMAGING     LAB RESULTS: Recent Labs  Lab 04/03/19 1201 04/04/19 0225  NA 140 138  K 4.3 4.0  CL 102 107  CO2 28 23  BUN 15 16  CREATININE 1.33* 1.25*  GLUCOSE 112* 109*   Recent Labs  Lab 04/03/19 1201 04/03/19 1804 04/04/19 0225  HGB 14.9 15.1 14.2  HCT 46.9 47.2 44.8  WBC 9.4  --  11.1*  PLT 129*  --  131*     IMAGING RESULTS: Dg Chest Port 1 View  Result Date: 04/03/2019 CLINICAL DATA:  Chest pain EXAM: PORTABLE CHEST 1 VIEW COMPARISON:  No 08/15/2018 FINDINGS: Normal heart size. Aortic atherosclerosis. No pleural effusion. Pulmonary vascular congestion without frank edema. Decreased lung volumes with bibasilar atelectasis. IMPRESSION: 1. Low lung volumes with bibasilar atelectasis and pulmonary vascular congestion. 2.  Aortic Atherosclerosis (ICD10-I70.0). Electronically Signed   By: Signa Kellaylor  Stroud M.D.   On: 04/03/2019 12:16      ASSESSMENT AND PLAN       ST elevation MI- Status post left heart cath -Cardiology on case follow recommendations -Aspirin and Brilinta as well as modified ACS protocol due to allergy to ACE inhibitor -oxygen as needed -follow up cardiac enzymes as indicated ICU monitoring -Optimizing  for downgrade from medical intensive care unit    Acute on chronic renal Failure-stage II -Improving -Possibly related to cardiac stress and procedure -follow chem 7 -follow UO -continue Foley Catheter-assess need daily   NEUROLOGY -History of alcoholism monitoring for withdrawal -Thiamine and folate   ID -continue IV abx as prescibed -follow up cultures  GI/Nutrition GI PROPHYLAXIS as indicated DIET-->TF's as tolerated Constipation protocol as indicated  ENDO - ICU hypoglycemic\Hyperglycemia protocol -check FSBS per protocol   ELECTROLYTES -follow labs as needed -replace as needed -pharmacy consultation   DVT/GI PRX ordered -SCDs  TRANSFUSIONS AS NEEDED MONITOR FSBS ASSESS the need for LABS as needed   Critical care provider statement:    Critical care time (minutes):  32   Critical care time was exclusive of:  Separately billable procedures and treating other patients   Critical care was necessary to treat or prevent imminent or life-threatening deterioration of the following conditions:   ST elevation MI, alcoholism, essential hypertension, acute kidney injury, multiple comorbid conditions   Critical care was time spent personally by me on the following activities:  Development of treatment  plan with patient or surrogate, discussions with consultants, evaluation of patient's response to treatment, examination of patient, obtaining history from patient or surrogate, ordering and performing treatments and interventions, ordering and review of laboratory studies and re-evaluation of patient's condition.  I assumed direction of critical care for this patient from another provider in my specialty: no    This document was prepared using Dragon voice recognition software and may include unintentional dictation errors.    Ottie Glazier, M.D.  Division of [place]

## 2019-04-04 NOTE — Progress Notes (Signed)
[facility] - [place] [facility]   PATIENT NAME: Frank ChiquitoRobert Faires    MR#:  [id number]  DATE OF BIRTH:  11/24/1930  SUBJECTIVE:  patient overall doing well. Status post cardiac cath. No chest pain.  REVIEW OF SYSTEMS:   Review of Systems  Constitutional: Negative for chills, fever and weight loss.  HENT: Negative for ear discharge, ear pain and nosebleeds.   Eyes: Negative for blurred vision, pain and discharge.  Respiratory: Negative for sputum production, shortness of breath, wheezing and stridor.   Cardiovascular: Negative for chest pain, palpitations, orthopnea and PND.  Gastrointestinal: Negative for abdominal pain, diarrhea, nausea and vomiting.  Genitourinary: Negative for frequency and urgency.  Musculoskeletal: Negative for back pain and joint pain.  Neurological: Negative for sensory change, speech change, focal weakness and weakness.  Psychiatric/Behavioral: Negative for depression and hallucinations. The patient is not nervous/anxious.    Tolerating Diet:yes Tolerating PT: pending  DRUG ALLERGIES:   Allergies  Allergen Reactions  . Ace Inhibitors Anaphylaxis  . Ciprofloxacin Anaphylaxis  . Penicillins Anaphylaxis    VITALS:  Blood pressure 112/80, pulse 64, temperature 97.6 F (36.4 C), temperature source Axillary, resp. rate (!) 25, height 6\' 2"  (1.88 m), weight 64.5 kg, SpO2 98 %.  PHYSICAL EXAMINATION:   Physical Exam  GENERAL:  85 y.o.-year-old patient lying in the bed with no acute distress.  EYES: Pupils equal, round, reactive to light and accommodation. No scleral icterus. Extraocular muscles intact.  HEENT: Head atraumatic, normocephalic. Oropharynx and nasopharynx clear.  NECK:  Supple, no jugular venous distention. No thyroid enlargement, no tenderness.  LUNGS: Normal breath sounds bilaterally, no wheezing, rales, rhonchi. No use of accessory muscles of respiration.  CARDIOVASCULAR: S1, S2 normal. No murmurs, rubs, or  gallops.  ABDOMEN: Soft, nontender, nondistended. Bowel sounds present. No organomegaly or mass.  EXTREMITIES: No cyanosis, clubbing or edema b/l.    NEUROLOGIC: Cranial nerves II through XII are intact. No focal Motor or sensory deficits b/l.   PSYCHIATRIC:  patient is alert and oriented x 3.  SKIN: No obvious rash, lesion, or ulcer.   LABORATORY PANEL:  CBC Recent Labs  Lab 04/04/19 0225  WBC 11.1*  HGB 14.2  HCT 44.8  PLT 131*    Chemistries  Recent Labs  Lab 04/03/19 1201 04/04/19 0225  NA 140 138  K 4.3 4.0  CL 102 107  CO2 28 23  GLUCOSE 112* 109*  BUN 15 16  CREATININE 1.33* 1.25*  CALCIUM 8.9 8.6*  AST 28  --   ALT 10  --   ALKPHOS 88  --   BILITOT 1.0  --    Cardiac Enzymes Recent Labs  Lab 04/04/19 0225  TROPONINI 41.21*   RADIOLOGY:  Dg Chest Port 1 View  Result Date: 04/03/2019 CLINICAL DATA:  Chest pain EXAM: PORTABLE CHEST 1 VIEW COMPARISON:  No 08/15/2018 FINDINGS: Normal heart size. Aortic atherosclerosis. No pleural effusion. Pulmonary vascular congestion without frank edema. Decreased lung volumes with bibasilar atelectasis. IMPRESSION: 1. Low lung volumes with bibasilar atelectasis and pulmonary vascular congestion. 2.  Aortic Atherosclerosis (ICD10-I70.0). Electronically Signed   By: Signa Kellaylor  Stroud M.D.   On: 04/03/2019 12:16   ASSESSMENT AND PLAN:   85 yo male with a PMH of HTN, Anemia, ETOH Abuse, Chronic Generalized Abdominal Pain, and CKD.  [facility] ER on 05/27 from [facility] via EMS with intermittent chest pain, onset of symptoms 2 days prior to presentation.  The morning of 05/27  the chest pain persisted prompting ER visit  *Acute ST elevation MI with  Cardiac cath is done and drug-eluting stent is placed in the circumflex Cardiology suggested aspirin and Brilinta. Also started on metoprolol and atorvastatin. Patient has allergy to ACE inhibitors. His LDL level is elevated..    *Uncontrolled  hypertension Metoprolol and amlodipine and continue to monitor.  *Hyperlipidemia Atorvastatin.  *Chronic kidney disease Avoid nephrotoxic agents and continue to monitor renal function.  Pt is from [facility]    CODE STATUS: full  DVT Prophylaxis: Lovenox  TOTAL TIME TAKING CARE OF THIS PATIENT: *30* minutes.  >50% time spent on counselling and coordination of care  POSSIBLE D/C IN *1-2* DAYS, DEPENDING ON CLINICAL CONDITION.  Note: This dictation was prepared with Dragon dictation along with smaller phrase technology. Any transcriptional errors that result from this process are unintentional.  Enedina Finner M.D on 04/04/2019 at 2:45 PM  Between 7am to 6pm - Pager - [phone]  After 6pm go to [email or web address] - Social research officer, government  [facility]  Office  [phone]  CC: Primary care physician; Hyman Hopes, MDPatient ID: Catarina Hartshorn, male   DOB: 31-Jan-1931, 85 y.o.   MRN: [id number]

## 2019-04-04 NOTE — Progress Notes (Addendum)
Initial Nutrition Assessment  RD working remotely.  DOCUMENTATION CODES:   Underweight  INTERVENTION:  Provide Ensure Enlive po BID, each supplement provides 350 kcal and 20 grams of protein.  Also recommend providing daily MVI and folic acid 1 mg daily. Continue thiamine 100 mg daily.  Monitor magnesium, potassium, and phosphorus daily for at least 3 days, MD to replete as needed, as pt is at risk for refeeding syndrome.  NUTRITION DIAGNOSIS:   Inadequate oral intake related to decreased appetite as evidenced by meal completion < 25%.  GOAL:   Patient will meet greater than or equal to 90% of their needs  MONITOR:   PO intake, Supplement acceptance, Labs, Weight trends, I & O's  REASON FOR ASSESSMENT:   Other (Comment)(Low BMI)    ASSESSMENT:   85 year old male with PMHx of EtOH abuse, HTN, CKD admitted with STEMI s/p PCI/DES to distal circumflex on 5/27.   Attempted to call patient over the phone for nutrition/weight history but he is hard of hearing and a poor historian. He reports he is unsure how he did with breakfast today. Per chart he only ate 15% of his breakfast. He reports he is also unsure how his appetite is or about his weight history. Per chart patient had type 7 BM this AM (diarrhea). Skin is intact. Weights in chart seem to fluctuate between 52.6-58 kg, so unsure if current weight of 64.5 kg is accurate.  Medications reviewed and include: thiamine 100 mg daily IV.  Labs reviewed: Creatinine 1.25.  Patient is at risk for malnutrition.  NUTRITION - FOCUSED PHYSICAL EXAM:  Unable to complete at this time.  Diet Order:   Diet Order            Diet Heart Room service appropriate? Yes; Fluid consistency: Thin  Diet effective now             EDUCATION NEEDS:   Not appropriate for education at this time(First need to address risk for malnutrition/adequacy of intake.)  Skin:  Skin Assessment: Reviewed RN Assessment  Last BM:  04/04/2019 - medium  type 7  Height:   Ht Readings from Last 1 Encounters:  04/03/19 6\' 2"  (1.88 m)   Weight:   Wt Readings from Last 1 Encounters:  04/03/19 64.5 kg   Ideal Body Weight:  86.4 kg  BMI:  Body mass index is 18.26 kg/m.  Estimated Nutritional Needs:   Kcal:  1700-1900  Protein:  85-95 grams  Fluid:  1.7-1.9 L/day  Helane Rima, MS, RD, LDN Office: [phone] Pager: [phone] After Hours/Weekend Pager: [phone]

## 2019-04-04 NOTE — NC FL2 (Addendum)
[place] MEDICAID FL2 LEVEL OF CARE SCREENING TOOL     IDENTIFICATION  Patient Name: Frank Jefferson Birthdate: 02/10/1931 Sex: male Admission Date (Current Location): 04/03/2019  [place] and [place] Number:  Chiropodist and Address:  [facility], [address] [place], [place] [address]      Provider Number: [id number]  Attending Physician Name and Address:  Enedina Finner, MD  Relative Name and Phone Number:  Jakhari Space [phone]    Current Level of Care: Hospital Recommended Level of Care: Assisted Living Facility Prior Approval Number:    Date Approved/Denied:   PASRR Number: [id number]  Discharge Plan: Other (Comment)(Assisted Living facility)    Current Diagnoses: Patient Active Problem List   Diagnosis Date Noted  . STEMI (ST elevation myocardial infarction) (HCC) 04/03/2019  . AKI (acute kidney injury) (HCC) 08/15/2018    Orientation RESPIRATION BLADDER Height & Weight     Self, Time, Situation, Place  Normal Continent Weight: 64.5 kg Height:   (188 cm)  BEHAVIORAL SYMPTOMS/MOOD NEUROLOGICAL BOWEL NUTRITION STATUS      Continent Diet  AMBULATORY STATUS COMMUNICATION OF NEEDS Skin   Supervision Verbally Normal                       Personal Care Assistance Level of Assistance              Functional Limitations Info             SPECIAL CARE FACTORS FREQUENCY   Home Health PT Home Health RN  Minimum 2x a week                  Contractures Contractures Info: Not present    Additional Factors Info  Code Status, Allergies Code Status Info: Full Allergies Info: Ace Inhibitors, Cipro, PCN           Current Medications (04/04/2019):  This is the current hospital active medication list Current Facility-Administered Medications  Medication Dose Route Frequency Provider Last Rate Last Dose  . 0.9 %  sodium chloride infusion   Intravenous Continuous Willy Eddy, MD      . 0.9  %  sodium chloride infusion  250 mL Intravenous PRN Callwood, Dwayne D, MD      . acetaminophen (TYLENOL) tablet 650 mg  650 mg Oral Q4H PRN Callwood, Dwayne D, MD      . ALPRAZolam Prudy Feeler) tablet 1 mg  1 mg Oral BID PRN Callwood, Dwayne D, MD      . amLODipine (NORVASC) tablet 5 mg  5 mg Oral BID Callwood, Dwayne D, MD   5 mg at 04/04/19 0759  . aspirin chewable tablet 324 mg  324 mg Oral Once Willy Eddy, MD      . aspirin chewable tablet 81 mg  81 mg Oral Daily Callwood, Dwayne D, MD   81 mg at 04/04/19 0759  . atorvastatin (LIPITOR) tablet 80 mg  80 mg Oral q1800 Callwood, Dwayne D, MD   80 mg at 04/03/19 1748  . diazepam (VALIUM) tablet 2 mg  2 mg Oral Q6H PRN Callwood, Dwayne D, MD      . feeding supplement (ENSURE ENLIVE) (ENSURE ENLIVE) liquid 237 mL  237 mL Oral BID BM Aleskerov, Fuad, MD      . metoprolol tartrate (LOPRESSOR) tablet 50 mg  50 mg Oral BID Callwood, Dwayne D, MD   50 mg at 04/04/19 0800  . [START ON 04/05/2019] multivitamin with minerals  tablet 1 tablet  1 tablet Oral Daily Vida Rigger, MD      . ondansetron (ZOFRAN) injection 4 mg  4 mg Intravenous Q6H PRN Callwood, Dwayne D, MD      . oxyCODONE (Oxy IR/ROXICODONE) immediate release tablet 5-10 mg  5-10 mg Oral Q4H PRN Callwood, Dwayne D, MD      . sodium chloride flush (NS) 0.9 % injection 3 mL  3 mL Intravenous Q12H Callwood, Dwayne D, MD   3 mL at 04/04/19 0801  . sodium chloride flush (NS) 0.9 % injection 3 mL  3 mL Intravenous PRN Callwood, Dwayne D, MD      . thiamine (B-1) injection 100 mg  100 mg Intravenous Daily Vida Rigger, MD   100 mg at 04/04/19 0759  . ticagrelor (BRILINTA) tablet 180 mg  180 mg Oral Once Willy Eddy, MD      . ticagrelor [employer]) tablet 90 mg  90 mg Oral BID Callwood, Dwayne D, MD   90 mg at 04/04/19 0800  . zolpidem (AMBIEN) tablet 5 mg  5 mg Oral QHS PRN Callwood, Dwayne D, MD         Discharge Medications: STOP taking these medications   carvedilol 12.5 MG  tablet Commonly known as:  COREG   tamsulosin 0.4 MG Caps capsule Commonly known as:  FLOMAX     TAKE these medications   acetaminophen 325 MG tablet Commonly known as:  TYLENOL Take 650 mg by mouth every 4 (four) hours as needed for fever. What changed:  Another medication with the same name was removed. Continue taking this medication, and follow the directions you see here.   amLODipine 5 MG tablet Commonly known as:  NORVASC Take 1 tablet (5 mg total) by mouth daily. Start taking on:  Apr 06, 2019   aspirin 81 MG chewable tablet Chew 1 tablet (81 mg total) by mouth daily. Start taking on:  Apr 06, 2019   atorvastatin 80 MG tablet Commonly known as:  LIPITOR Take 1 tablet (80 mg total) by mouth daily at 6 PM.   BAZA PROTECT EX Apply 1 application topically 2 (two) times daily as needed (skin protection).   bismuth subsalicylate 262 MG/15ML suspension Commonly known as:  PEPTO BISMOL Take 15 mLs by mouth every 2 (two) hours as needed for indigestion (max 6 doses in 24 hours).   dicyclomine 10 MG capsule Commonly known as:  BENTYL Take 10 mg by mouth 4 (four) times daily.   folic acid 1 MG tablet Commonly known as:  FOLVITE Take 1 mg by mouth daily.   HONEY LEMON COUGH DROPS MT Use as directed 1 tablet in the mouth or throat every 3 (three) hours as needed (cough).   loperamide 2 MG capsule Commonly known as:  IMODIUM Take 2 mg by mouth as needed for diarrhea or loose stools (max 4 doses).   LORazepam 0.5 MG tablet Commonly known as:  ATIVAN Take 0.5 mg by mouth 2 (two) times daily as needed for anxiety.   magnesium oxide 400 MG tablet Commonly known as:  MAG-OX Take 400 mg by mouth 3 (three) times daily.   metoprolol tartrate 25 MG tablet Commonly known as:  LOPRESSOR Take 1 tablet (25 mg total) by mouth 2 (two) times daily.   Milk of Magnesia 400 MG/5ML suspension Generic drug:  magnesium hydroxide Take 30 mLs by mouth daily as needed  for mild constipation.   Mintox 200-200-20 MG/5ML suspension Generic drug:  alum & mag hydroxide-simeth  Take 30 mLs by mouth daily as needed for indigestion or heartburn.   psyllium 58.6 % powder Commonly known as:  METAMUCIL Take 1 packet by mouth 2 (two) times a day.   ROBITUSSIN CF PO Take 10 mLs by mouth every 6 (six) hours as needed (cough/congestion).   sodium phosphate 7-19 GM/118ML Enem Place 1 enema rectally daily as needed for severe constipation (not relieved by milk of mag and prune juice).   thiamine 100 MG tablet Commonly known as:  VITAMIN B-1 Take 100 mg by mouth daily.   ticagrelor 90 MG Tabs tablet Commonly known as:  BRILINTA Take 1 tablet (90 mg total) by mouth 2 (two) times daily.    Relevant Imaging Results:  Relevant Lab Results:   Additional Information  Updated Windell Mouldingric Thresia Ramanathan, MSW, LCSW 04/05/19 2:37 PM   Allayne ButcherJeanna M Creech, RN

## 2019-04-04 NOTE — TOC Initial Note (Signed)
Transition of Care [facility](TOC) - Initial/Assessment Note    Patient Details  Name: Frank Jefferson MRN: [id number] Date of Birth: 12/05/1930  Transition of Care [facility](TOC) CM/SW Contact:    Allayne ButcherJeanna M Tyreshia Ingman, RN Phone Number: 04/04/2019, 12:02 PM  Clinical Narrative:                 Patient admitted with STEMI, s/p cardiac cath with stent placement.  Patient is currently in the ICU and needs to stay in ICU for 24 hours.  Potential discharge tomorrow.  Patient reports that he is [facility] the resident coordinator [name] and the [facility].  [name] reports that they will provide transportation at discharge but they would like to have a negative COVID the day of discharge so the patient does not have to stay in quarantine for 14 days.  RNCM also spoke with patient's brother [name] and he has been updated.  Patient reports that he is independent and walks with a cane.  Myrene GalasHarriet Burns is patient PCP and he uses [facility] for Prescriptions.   [name]'s number is [phone].   Expected Discharge Plan: Home/Self Care Barriers to Discharge: Continued Medical Work up   Patient Goals and CMS Choice        Expected Discharge Plan and Services Expected Discharge Plan: Home/Self Care   Discharge Planning Services: CM Consult   Living arrangements for the past 2 months: Assisted Living Facility Expected Discharge Date: 04/05/19                                    Prior Living Arrangements/Services Living arrangements for the past 2 months: Assisted Living Facility Lives with:: Facility Resident Patient language and need for interpreter reviewed:: No Do you feel safe going back to the place where you live?: Yes      Need for Family Participation in Patient Care: Yes (Comment) Care giver support system in place?: Yes (comment)(has brother and sister in law)   Criminal Activity/Legal Involvement Pertinent to Current  Situation/Hospitalization: No - Comment as needed  Activities of Daily Living Home Assistive Devices/Equipment: Cane (specify quad or straight) ADL Screening (condition at time of admission) Patient's cognitive ability adequate to safely complete daily activities?: Yes Is the patient deaf or have difficulty hearing?: Yes Does the patient have difficulty seeing, even when wearing glasses/contacts?: No Does the patient have difficulty concentrating, remembering, or making decisions?: Yes Patient able to express need for assistance with ADLs?: Yes Does the patient have difficulty dressing or bathing?: Yes Independently performs ADLs?: No Communication: Independent Dressing (OT): Independent Grooming: Independent Feeding: Independent Bathing: Needs assistance Is this a change from baseline?: Pre-admission baseline Toileting: Needs assistance Is this a change from baseline?: Pre-admission baseline In/Out Bed: Independent Walks in Home: Independent Does the patient have difficulty walking or climbing stairs?: Yes Weakness of Legs: Both Weakness of Arms/Hands: None  Permission Sought/Granted Permission sought to share information with : Facility Medical sales representativeContact Representative, Family Supports Permission granted to share information with : Yes, Verbal Permission Granted     Permission granted to share info w AGENCY: [facility]- [name]  Permission granted to share info w Relationship: Brother [name]     Emotional Assessment Appearance:: Appears stated age Attitude/Demeanor/Rapport: Engaged Affect (typically observed): Accepting Orientation: : Oriented to Self, Oriented to Place, Oriented to  Time, Oriented to  Situation Alcohol / Substance Use: Not Applicable Psych Involvement: No (comment)  Admission diagnosis:  STEMI (ST elevation myocardial infarction) [facility]) [I21.3] Patient Active Problem List   Diagnosis Date Noted  . STEMI (ST elevation myocardial  infarction) (HCC) 04/03/2019  . AKI (acute kidney injury) (HCC) 08/15/2018   PCP:  Hyman Hopes, MD Pharmacy:   [facility] [phone] - [place], [place] - [address] [place] [address] Phone: [phone] Fax: [phone]     Social Determinants of Health (SDOH) Interventions    Readmission Risk Interventions No flowsheet data found.

## 2019-04-04 NOTE — Progress Notes (Signed)
Attempted to call patient's family member Steward Drone w/ an update for shift. No answer and no voicemail setup. Jari Favre [facility]

## 2019-04-04 NOTE — Progress Notes (Signed)
*  PRELIMINARY RESULTS* Echocardiogram 2D Echocardiogram has been performed.  Joanette Gula Fread Kottke 04/04/2019, 9:13 AM

## 2019-04-04 NOTE — Progress Notes (Signed)
Pt transferred to room 243 by writing RN and NT.  Tosha on hand to take report at bedside.  Pt on room air, no CP, VSS.  He was agreeable to transfer.  Chart and belongings and meds transferred with him.

## 2019-04-05 ENCOUNTER — Encounter: Payer: Self-pay | Admitting: *Deleted

## 2019-04-05 LAB — ABO/RH: ABO/RH(D): B POS

## 2019-04-05 LAB — SARS CORONAVIRUS 2 BY RT PCR (HOSPITAL ORDER, PERFORMED IN ~~LOC~~ HOSPITAL LAB): SARS Coronavirus 2: NEGATIVE

## 2019-04-05 MED ORDER — AMLODIPINE BESYLATE 5 MG PO TABS: 5.0000 mg | ORAL_TABLET | Freq: Every day | ORAL | 1 refills | Status: AC

## 2019-04-05 MED ORDER — ATORVASTATIN CALCIUM 80 MG PO TABS: 80.0000 mg | ORAL_TABLET | Freq: Every day | ORAL | 1 refills | Status: AC

## 2019-04-05 MED ORDER — ASPIRIN 81 MG PO CHEW: 81.0000 mg | CHEWABLE_TABLET | Freq: Every day | ORAL | 1 refills | Status: AC

## 2019-04-05 MED ORDER — TICAGRELOR 90 MG PO TABS: 90.0000 mg | ORAL_TABLET | Freq: Two times a day (BID) | ORAL | 2 refills | Status: DC

## 2019-04-05 MED ORDER — VITAMIN B-1 100 MG PO TABS: 100.0000 mg | ORAL_TABLET | Freq: Every day | ORAL | Status: DC

## 2019-04-05 MED ORDER — ENOXAPARIN SODIUM 40 MG/0.4ML ~~LOC~~ SOLN: 40.0000 mg | SUBCUTANEOUS | Status: DC

## 2019-04-05 MED ORDER — METOPROLOL TARTRATE 25 MG PO TABS: 25.0000 mg | ORAL_TABLET | Freq: Two times a day (BID) | ORAL | 1 refills | Status: AC

## 2019-04-05 NOTE — NC FL2 (Addendum)
MEDICAID FL2 LEVEL OF CARE SCREENING TOOL     IDENTIFICATION  Patient Name: Frank Jefferson Birthdate: 05-Feb-1931 Sex: male Admission Date (Current Location): 04/03/2019  [place] Number:  Chiropodist and Address:  [facility], [address] [place], [place] [address]      Provider Number: [id number]  Attending Physician Name and Address:  Enedina Finner, MD  Relative Name and Phone Number:  Bertran Zeimet [phone]    Current Level of Care: Hospital Recommended Level of Care: Assisted Living Facility Prior Approval Number:    Date Approved/Denied:   PASRR Number: [id number]  Discharge Plan: Other (Comment)(ALF [facility])    Current Diagnoses: Patient Active Problem List   Diagnosis Date Noted  . STEMI (ST elevation myocardial infarction) (HCC) 04/03/2019  . AKI (acute kidney injury) (HCC) 08/15/2018    Orientation RESPIRATION BLADDER Height & Weight     Self, Time, Situation, Place  Normal Continent Weight: 142 lb 3.2 oz (64.5 kg) Height:   (188 cm)  BEHAVIORAL SYMPTOMS/MOOD NEUROLOGICAL BOWEL NUTRITION STATUS      Continent Diet(Cardiac)  AMBULATORY STATUS COMMUNICATION OF NEEDS Skin   Limited Assist Verbally Normal                       Personal Care Assistance Level of Assistance  Bathing, Feeding, Dressing Bathing Assistance: Limited assistance Feeding assistance: Independent Dressing Assistance: Limited assistance     Functional Limitations Info  Sight, Hearing, Speech Sight Info: Adequate Hearing Info: Adequate Speech Info: Adequate    SPECIAL CARE FACTORS FREQUENCY  PT (By licensed PT)     PT Frequency: Home Health minimum 2x a week              Contractures Contractures Info: Not present    Additional Factors Info  Code Status, Allergies Code Status Info: Full Code Allergies Info: ACE INHIBITORS, CIPROFLOXACIN, PENICILLINS           Current Medications  (04/05/2019):  This is the current hospital active medication list Current Facility-Administered Medications  Medication Dose Route Frequency Provider Last Rate Last Dose  . 0.9 %  sodium chloride infusion  250 mL Intravenous PRN Callwood, Dwayne D, MD      . acetaminophen (TYLENOL) tablet 650 mg  650 mg Oral Q4H PRN Callwood, Dwayne D, MD      . ALPRAZolam Prudy Feeler) tablet 1 mg  1 mg Oral BID PRN Callwood, Dwayne D, MD      . amLODipine (NORVASC) tablet 5 mg  5 mg Oral Daily Callwood, Dwayne D, MD   5 mg at 04/05/19 0900  . aspirin chewable tablet 324 mg  324 mg Oral Once Willy Eddy, MD      . aspirin chewable tablet 81 mg  81 mg Oral Daily Callwood, Dwayne D, MD   81 mg at 04/05/19 0901  . atorvastatin (LIPITOR) tablet 80 mg  80 mg Oral q1800 Callwood, Dwayne D, MD   80 mg at 04/04/19 1704  . diazepam (VALIUM) tablet 2 mg  2 mg Oral Q6H PRN Callwood, Dwayne D, MD      . enoxaparin (LOVENOX) injection 40 mg  40 mg Subcutaneous Q24H Enedina Finner, MD      . feeding supplement (ENSURE ENLIVE) (ENSURE ENLIVE) liquid 237 mL  237 mL Oral BID BM Vida Rigger, MD   237 mL at 04/04/19 1234  . metoprolol tartrate (LOPRESSOR) tablet 25 mg  25 mg Oral BID Callwood,  Dwayne D, MD   25 mg at 04/05/19 0901  . multivitamin with minerals tablet 1 tablet  1 tablet Oral Daily Vida Rigger, MD   1 tablet at 04/05/19 0901  . ondansetron (ZOFRAN) injection 4 mg  4 mg Intravenous Q6H PRN Callwood, Dwayne D, MD      . oxyCODONE (Oxy IR/ROXICODONE) immediate release tablet 5-10 mg  5-10 mg Oral Q4H PRN Callwood, Dwayne D, MD      . sodium chloride flush (NS) 0.9 % injection 3 mL  3 mL Intravenous Q12H Callwood, Dwayne D, MD   3 mL at 04/05/19 0907  . sodium chloride flush (NS) 0.9 % injection 3 mL  3 mL Intravenous PRN Callwood, Dwayne D, MD      . Melene Muller ON 04/06/2019] thiamine (VITAMIN B-1) tablet 100 mg  100 mg Oral Daily Shanlever, Charles M, RPH      . ticagrelor [facility]) tablet 180 mg  180 mg Oral Once  Willy Eddy, MD      . ticagrelor [facility]) tablet 90 mg  90 mg Oral BID Callwood, Dwayne D, MD   90 mg at 04/05/19 0902  . zolpidem (AMBIEN) tablet 5 mg  5 mg Oral QHS PRN Callwood, Dwayne D, MD         Discharge Medications: STOP taking these medications   carvedilol 12.5 MG tablet Commonly known as:  COREG   tamsulosin 0.4 MG Caps capsule Commonly known as:  FLOMAX     TAKE these medications   acetaminophen 325 MG tablet Commonly known as:  TYLENOL Take 650 mg by mouth every 4 (four) hours as needed for fever. What changed:  Another medication with the same name was removed. Continue taking this medication, and follow the directions you see here.   amLODipine 5 MG tablet Commonly known as:  NORVASC Take 1 tablet (5 mg total) by mouth daily. Start taking on:  Apr 06, 2019   aspirin 81 MG chewable tablet Chew 1 tablet (81 mg total) by mouth daily. Start taking on:  Apr 06, 2019   atorvastatin 80 MG tablet Commonly known as:  LIPITOR Take 1 tablet (80 mg total) by mouth daily at 6 PM.   BAZA PROTECT EX Apply 1 application topically 2 (two) times daily as needed (skin protection).   bismuth subsalicylate 262 MG/15ML suspension Commonly known as:  PEPTO BISMOL Take 15 mLs by mouth every 2 (two) hours as needed for indigestion (max 6 doses in 24 hours).   dicyclomine 10 MG capsule Commonly known as:  BENTYL Take 10 mg by mouth 4 (four) times daily.   folic acid 1 MG tablet Commonly known as:  FOLVITE Take 1 mg by mouth daily.   HONEY LEMON COUGH DROPS MT Use as directed 1 tablet in the mouth or throat every 3 (three) hours as needed (cough).   loperamide 2 MG capsule Commonly known as:  IMODIUM Take 2 mg by mouth as needed for diarrhea or loose stools (max 4 doses).   LORazepam 0.5 MG tablet Commonly known as:  ATIVAN Take 0.5 mg by mouth 2 (two) times daily as needed for anxiety.   magnesium oxide 400 MG tablet Commonly known as:   MAG-OX Take 400 mg by mouth 3 (three) times daily.   metoprolol tartrate 25 MG tablet Commonly known as:  LOPRESSOR Take 1 tablet (25 mg total) by mouth 2 (two) times daily.   Milk of Magnesia 400 MG/5ML suspension Generic drug:  magnesium hydroxide Take 30 mLs by  mouth daily as needed for mild constipation.   Mintox 200-200-20 MG/5ML suspension Generic drug:  alum & mag hydroxide-simeth Take 30 mLs by mouth daily as needed for indigestion or heartburn.   psyllium 58.6 % powder Commonly known as:  METAMUCIL Take 1 packet by mouth 2 (two) times a day.   ROBITUSSIN CF PO Take 10 mLs by mouth every 6 (six) hours as needed (cough/congestion).   sodium phosphate 7-19 GM/118ML Enem Place 1 enema rectally daily as needed for severe constipation (not relieved by milk of mag and prune juice).   thiamine 100 MG tablet Commonly known as:  VITAMIN B-1 Take 100 mg by mouth daily.   ticagrelor 90 MG Tabs tablet Commonly known as:  BRILINTA Take 1 tablet (90 mg total) by mouth 2 (two) times daily.    Relevant Imaging Results:  Relevant Lab Results:   Additional Information SSN [id number]  Darleene Cleavernterhaus, Mizani Dilday R, LCSW

## 2019-04-05 NOTE — Progress Notes (Signed)
Subjective:  Patient states to feel reasonably well denies any further chest pain feels like he is ready to be discharged and go home blood pressure is much improved  Objective:  Vital Signs in the last 24 hours: Temp:  [98.3 F (36.8 C)-98.6 F (37 C)] 98.3 F (36.8 C) (05/29 0732) Pulse Rate:  [65-68] 67 (05/29 0732) Resp:  [16-20] 16 (05/29 0732) BP: (111-136)/(57-83) 131/83 (05/29 0732) SpO2:  [96 %-100 %] 100 % (05/29 0732)  Intake/Output from previous day: 05/28 0701 - 05/29 0700 In: 762.6 [P.O.:707; I.V.:3; IV Piggyback:52.6] Out: 300 [Urine:300] Intake/Output from this shift: Total I/O In: -  Out: 50 [Urine:50]  Physical Exam: General appearance: appears stated age Neck: no adenopathy, no carotid bruit, no JVD, supple, symmetrical, trachea midline and thyroid not enlarged, symmetric, no tenderness/mass/nodules Lungs: clear to auscultation bilaterally Heart: regular rate and rhythm, S1, S2 normal, no murmur, click, rub or gallop Abdomen: soft, non-tender; bowel sounds normal; no masses,  no organomegaly Extremities: extremities normal, atraumatic, no cyanosis or edema Pulses: 2+ and symmetric Skin: Skin color, texture, turgor normal. No rashes or lesions Neurologic: Alert and oriented X 3, normal strength and tone. Normal symmetric reflexes. Normal coordination and gait  Lab Results: Recent Labs    04/03/19 1201 04/03/19 1804 04/04/19 0225  WBC 9.4  --  11.1*  HGB 14.9 15.1 14.2  PLT 129*  --  131*   Recent Labs    04/03/19 1201 04/04/19 0225  NA 140 138  K 4.3 4.0  CL 102 107  CO2 28 23  GLUCOSE 112* 109*  BUN 15 16  CREATININE 1.33* 1.25*   Recent Labs    04/03/19 1804 04/04/19 0225  TROPONINI 49.27* 41.21*   Hepatic Function Panel Recent Labs    04/03/19 1201  PROT 7.4  ALBUMIN 3.8  AST 28  ALT 10  ALKPHOS 88  BILITOT 1.0   Recent Labs    04/03/19 1201  CHOL 283*   No results for input(s): PROTIME in the last 72  hours.  Imaging: Imaging results have been reviewed  Cardiac Studies:  Assessment/Plan:  Status post STEMI inferior posterior  Status post PCI and stent DES to circumflex Hypertension difficult to controlled now on amlodipine and metoprolol Elevated troponin peak of around 50 now improving Hyperlipidemia currently on high-dose lipid therapy with Lipitor 80 mg once a day Continue aspirin Brilinta for at least 12 months Mild renal insufficiency maintain adequate hydration Have the patient follow-up with cardiology 1 to 2 weeks  LOS: 2 days    Estaban [place] D Nicol Herbig 04/05/2019, 1:24 PM

## 2019-04-05 NOTE — NC FL2 (Deleted)
[place] MEDICAID FL2 LEVEL OF CARE SCREENING TOOL     IDENTIFICATION  Patient Name: Frank Jefferson Birthdate: Mar 15, 1931 Sex: male Admission Date (Current Location): 04/03/2019  [place] and [place] Number:  Chiropodist and Address:  [facility], [address] [place], [place] [address]      Provider Number: [id number]  Attending Physician Name and Address:  Enedina Finner, MD  Relative Name and Phone Number:  Isay Trochez [phone]    Current Level of Care: Hospital Recommended Level of Care: Assisted Living Facility Prior Approval Number:    Date Approved/Denied:   PASRR Number: [id number]  Discharge Plan: Other (Comment)(ALF [facility])    Current Diagnoses: Patient Active Problem List   Diagnosis Date Noted  . STEMI (ST elevation myocardial infarction) (HCC) 04/03/2019  . AKI (acute kidney injury) (HCC) 08/15/2018    Orientation RESPIRATION BLADDER Height & Weight     Self, Time, Situation, Place  Normal Continent Weight: 142 lb 3.2 oz (64.5 kg) Height:  6\' 2"  (188 cm)  BEHAVIORAL SYMPTOMS/MOOD NEUROLOGICAL BOWEL NUTRITION STATUS      Continent Diet(Cardiac)  AMBULATORY STATUS COMMUNICATION OF NEEDS Skin   Limited Assist Verbally Normal                       Personal Care Assistance Level of Assistance  Bathing, Feeding, Dressing Bathing Assistance: Limited assistance Feeding assistance: Independent Dressing Assistance: Limited assistance     Functional Limitations Info  Sight, Hearing, Speech Sight Info: Adequate Hearing Info: Adequate Speech Info: Adequate    SPECIAL CARE FACTORS FREQUENCY                       Contractures Contractures Info: Not present    Additional Factors Info  Code Status, Allergies Code Status Info: Full Code Allergies Info: ACE INHIBITORS, CIPROFLOXACIN, PENICILLINS           Current Medications (04/05/2019):  This is the current hospital active medication  list Current Facility-Administered Medications  Medication Dose Route Frequency Provider Last Rate Last Dose  . 0.9 %  sodium chloride infusion  250 mL Intravenous PRN Callwood, Dwayne D, MD      . acetaminophen (TYLENOL) tablet 650 mg  650 mg Oral Q4H PRN Callwood, Dwayne D, MD      . ALPRAZolam Prudy Feeler) tablet 1 mg  1 mg Oral BID PRN Callwood, Dwayne D, MD      . amLODipine (NORVASC) tablet 5 mg  5 mg Oral Daily Callwood, Dwayne D, MD   5 mg at 04/05/19 0900  . aspirin chewable tablet 324 mg  324 mg Oral Once Willy Eddy, MD      . aspirin chewable tablet 81 mg  81 mg Oral Daily Callwood, Dwayne D, MD   81 mg at 04/05/19 0901  . atorvastatin (LIPITOR) tablet 80 mg  80 mg Oral q1800 Callwood, Dwayne D, MD   80 mg at 04/04/19 1704  . diazepam (VALIUM) tablet 2 mg  2 mg Oral Q6H PRN Callwood, Dwayne D, MD      . enoxaparin (LOVENOX) injection 40 mg  40 mg Subcutaneous Q24H Enedina Finner, MD      . feeding supplement (ENSURE ENLIVE) (ENSURE ENLIVE) liquid 237 mL  237 mL Oral BID BM Vida Rigger, MD   237 mL at 04/04/19 1234  . metoprolol tartrate (LOPRESSOR) tablet 25 mg  25 mg Oral BID Callwood, Dwayne D, MD   25 mg at  04/05/19 0901  . multivitamin with minerals tablet 1 tablet  1 tablet Oral Daily Vida Rigger, MD   1 tablet at 04/05/19 0901  . ondansetron (ZOFRAN) injection 4 mg  4 mg Intravenous Q6H PRN Callwood, Dwayne D, MD      . oxyCODONE (Oxy IR/ROXICODONE) immediate release tablet 5-10 mg  5-10 mg Oral Q4H PRN Callwood, Dwayne D, MD      . sodium chloride flush (NS) 0.9 % injection 3 mL  3 mL Intravenous Q12H Callwood, Dwayne D, MD   3 mL at 04/05/19 0907  . sodium chloride flush (NS) 0.9 % injection 3 mL  3 mL Intravenous PRN Callwood, Dwayne D, MD      . Melene Muller ON 04/06/2019] thiamine (VITAMIN B-1) tablet 100 mg  100 mg Oral Daily Shanlever, Charles M, RPH      . ticagrelor [facility]) tablet 180 mg  180 mg Oral Once Willy Eddy, MD      . ticagrelor [facility]) tablet 90  mg  90 mg Oral BID Callwood, Dwayne D, MD   90 mg at 04/05/19 0902  . zolpidem (AMBIEN) tablet 5 mg  5 mg Oral QHS PRN Callwood, Dwayne D, MD         Discharge Medications: STOP taking these medications   carvedilol 12.5 MG tablet Commonly known as:  COREG   tamsulosin 0.4 MG Caps capsule Commonly known as:  FLOMAX     TAKE these medications   acetaminophen 325 MG tablet Commonly known as:  TYLENOL Take 650 mg by mouth every 4 (four) hours as needed for fever. What changed:  Another medication with the same name was removed. Continue taking this medication, and follow the directions you see here.   amLODipine 5 MG tablet Commonly known as:  NORVASC Take 1 tablet (5 mg total) by mouth daily. Start taking on:  Apr 06, 2019   aspirin 81 MG chewable tablet Chew 1 tablet (81 mg total) by mouth daily. Start taking on:  Apr 06, 2019   atorvastatin 80 MG tablet Commonly known as:  LIPITOR Take 1 tablet (80 mg total) by mouth daily at 6 PM.   BAZA PROTECT EX Apply 1 application topically 2 (two) times daily as needed (skin protection).   bismuth subsalicylate 262 MG/15ML suspension Commonly known as:  PEPTO BISMOL Take 15 mLs by mouth every 2 (two) hours as needed for indigestion (max 6 doses in 24 hours).   dicyclomine 10 MG capsule Commonly known as:  BENTYL Take 10 mg by mouth 4 (four) times daily.   folic acid 1 MG tablet Commonly known as:  FOLVITE Take 1 mg by mouth daily.   HONEY LEMON COUGH DROPS MT Use as directed 1 tablet in the mouth or throat every 3 (three) hours as needed (cough).   loperamide 2 MG capsule Commonly known as:  IMODIUM Take 2 mg by mouth as needed for diarrhea or loose stools (max 4 doses).   LORazepam 0.5 MG tablet Commonly known as:  ATIVAN Take 0.5 mg by mouth 2 (two) times daily as needed for anxiety.   magnesium oxide 400 MG tablet Commonly known as:  MAG-OX Take 400 mg by mouth 3 (three) times daily.   metoprolol  tartrate 25 MG tablet Commonly known as:  LOPRESSOR Take 1 tablet (25 mg total) by mouth 2 (two) times daily.   Milk of Magnesia 400 MG/5ML suspension Generic drug:  magnesium hydroxide Take 30 mLs by mouth daily as needed for mild constipation.  Mintox 200-200-20 MG/5ML suspension Generic drug:  alum & mag hydroxide-simeth Take 30 mLs by mouth daily as needed for indigestion or heartburn.   psyllium 58.6 % powder Commonly known as:  METAMUCIL Take 1 packet by mouth 2 (two) times a day.   ROBITUSSIN CF PO Take 10 mLs by mouth every 6 (six) hours as needed (cough/congestion).   sodium phosphate 7-19 GM/118ML Enem Place 1 enema rectally daily as needed for severe constipation (not relieved by milk of mag and prune juice).   thiamine 100 MG tablet Commonly known as:  VITAMIN B-1 Take 100 mg by mouth daily.   ticagrelor 90 MG Tabs tablet Commonly known as:  BRILINTA Take 1 tablet (90 mg total) by mouth 2 (two) times daily.     Relevant Imaging Results:  Relevant Lab Results:   Additional Information SSN [id number]  Darleene Cleavernterhaus, Maddelynn Moosman R, LCSW

## 2019-04-05 NOTE — Discharge Summary (Addendum)
[facility] [place] at [facility]   PATIENT NAME: Frank Jefferson    MR#:  [id number]  DATE OF BIRTH:  1931/03/16  DATE OF ADMISSION:  04/03/2019 ADMITTING PHYSICIAN: Alwyn Pea, MD  DATE OF DISCHARGE: 04/05/2019  PRIMARY CARE PHYSICIAN: Hyman Hopes, MD    ADMISSION DIAGNOSIS:  STEMI (ST elevation myocardial infarction) (HCC) [I21.3]  DISCHARGE DIAGNOSIS:  Acute STEMI status post cardiac cath with drug-eluting stent to circumflex  SECONDARY DIAGNOSIS:   Past Medical History:  Diagnosis Date  . Alcohol abuse   . Chronic kidney disease   . Hypertension     HOSPITAL COURSE:   85 yo male with a PMH of HTN, Anemia, ETOH Abuse, Chronic Generalized Abdominal Pain, and CKD. He presented to [facility] ER on 05/27 from [facility] via EMS with intermittent chest pain, onset of symptoms 2 days prior to presentation. The morning of 05/27 the chest pain persisted prompting ER visit  *Acute ST elevation MI  Cardiac cath  With drug-eluting stent is placed in the circumflex  aspirin and Brilinta.  on metoprolol and atorvastatin. Patient has allergy to ACE inhibitors. His LDL level is elevated. Patient will follow-up with Dr. Juliann Pares as outpatient patient will benefit from outpatient cardiac rehab  *Uncontrolled hypertension Metoprolol and amlodipine and continue to monitor.  *Hyperlipidemia Atorvastatin.  *Chronic kidney disease Avoid nephrotoxic agents and continue to monitor renal function.  Pt is from [facility]-- will discharged today with follow-up PCP and cardiology  CONSULTS OBTAINED:    DRUG ALLERGIES:   Allergies  Allergen Reactions  . Ace Inhibitors Anaphylaxis  . Ciprofloxacin Anaphylaxis  . Penicillins Anaphylaxis    DISCHARGE MEDICATIONS:   Allergies as of 04/05/2019      Reactions   Ace Inhibitors Anaphylaxis   Ciprofloxacin Anaphylaxis   Penicillins Anaphylaxis      Medication  List    STOP taking these medications   carvedilol 12.5 MG tablet Commonly known as:  COREG   tamsulosin 0.4 MG Caps capsule Commonly known as:  FLOMAX     TAKE these medications   acetaminophen 325 MG tablet Commonly known as:  TYLENOL Take 650 mg by mouth every 4 (four) hours as needed for fever. What changed:  Another medication with the same name was removed. Continue taking this medication, and follow the directions you see here.   amLODipine 5 MG tablet Commonly known as:  NORVASC Take 1 tablet (5 mg total) by mouth daily. Start taking on:  Apr 06, 2019   aspirin 81 MG chewable tablet Chew 1 tablet (81 mg total) by mouth daily. Start taking on:  Apr 06, 2019   atorvastatin 80 MG tablet Commonly known as:  LIPITOR Take 1 tablet (80 mg total) by mouth daily at 6 PM.   BAZA PROTECT EX Apply 1 application topically 2 (two) times daily as needed (skin protection).   bismuth subsalicylate 262 MG/15ML suspension Commonly known as:  PEPTO BISMOL Take 15 mLs by mouth every 2 (two) hours as needed for indigestion (max 6 doses in 24 hours).   dicyclomine 10 MG capsule Commonly known as:  BENTYL Take 10 mg by mouth 4 (four) times daily.   folic acid 1 MG tablet Commonly known as:  FOLVITE Take 1 mg by mouth daily.   HONEY LEMON COUGH DROPS MT Use as directed 1 tablet in the mouth or throat every 3 (three) hours as needed (cough).   loperamide 2 MG capsule Commonly known  as:  IMODIUM Take 2 mg by mouth as needed for diarrhea or loose stools (max 4 doses).   LORazepam 0.5 MG tablet Commonly known as:  ATIVAN Take 0.5 mg by mouth 2 (two) times daily as needed for anxiety.   magnesium oxide 400 MG tablet Commonly known as:  MAG-OX Take 400 mg by mouth 3 (three) times daily.   metoprolol tartrate 25 MG tablet Commonly known as:  LOPRESSOR Take 1 tablet (25 mg total) by mouth 2 (two) times daily.   Milk of Magnesia 400 MG/5ML suspension Generic drug:  magnesium  hydroxide Take 30 mLs by mouth daily as needed for mild constipation.   Mintox 200-200-20 MG/5ML suspension Generic drug:  alum & mag hydroxide-simeth Take 30 mLs by mouth daily as needed for indigestion or heartburn.   psyllium 58.6 % powder Commonly known as:  METAMUCIL Take 1 packet by mouth 2 (two) times a day.   ROBITUSSIN CF PO Take 10 mLs by mouth every 6 (six) hours as needed (cough/congestion).   sodium phosphate 7-19 GM/118ML Enem Place 1 enema rectally daily as needed for severe constipation (not relieved by milk of mag and prune juice).   thiamine 100 MG tablet Commonly known as:  VITAMIN B-1 Take 100 mg by mouth daily.   ticagrelor 90 MG Tabs tablet Commonly known as:  BRILINTA Take 1 tablet (90 mg total) by mouth 2 (two) times daily.       If you experience worsening of your admission symptoms, develop shortness of breath, life threatening emergency, suicidal or homicidal thoughts you must seek medical attention immediately by calling 911 or calling your MD immediately  if symptoms less severe.  You Must read complete instructions/literature along with all the possible adverse reactions/side effects for all the Medicines you take and that have been prescribed to you. Take any new Medicines after you have completely understood and accept all the possible adverse reactions/side effects.   Please note  You were cared for by a hospitalist during your hospital stay. If you have any questions about your discharge medications or the care you received while you were in the hospital after you are discharged, you can call the unit and asked to speak with the hospitalist on call if the hospitalist that took care of you is not available. Once you are discharged, your primary care physician will handle any further medical issues. Please note that NO REFILLS for any discharge medications will be authorized once you are discharged, as it is imperative that you return to your primary  care physician (or establish a relationship with a primary care physician if you do not have one) for your aftercare needs so that they can reassess your need for medications and monitor your lab values. Today   SUBJECTIVE   No new complaints.  VITAL SIGNS:  Blood pressure 131/83, pulse 67, temperature 98.3 F (36.8 C), temperature source Oral, resp. rate 16, height 6\' 2"  (1.88 m), weight 64.5 kg, SpO2 100 %.  I/O:    Intake/Output Summary (Last 24 hours) at 04/05/2019 1414 Last data filed at 04/05/2019 0900 Gross per 24 hour  Intake 123 ml  Output 225 ml  Net -102 ml    PHYSICAL EXAMINATION:  GENERAL:  85 y.o.-year-old patient lying in the bed with no acute distress.  EYES: Pupils equal, round, reactive to light and accommodation. No scleral icterus. Extraocular muscles intact.  HEENT: Head atraumatic, normocephalic. Oropharynx and nasopharynx clear.  NECK:  Supple, no jugular venous distention. No  thyroid enlargement, no tenderness.  LUNGS: Normal breath sounds bilaterally, no wheezing, rales,rhonchi or crepitation. No use of accessory muscles of respiration.  CARDIOVASCULAR: S1, S2 normal. No murmurs, rubs, or gallops.  ABDOMEN: Soft, non-tender, non-distended. Bowel sounds present. No organomegaly or mass.  EXTREMITIES: No pedal edema, cyanosis, or clubbing.  NEUROLOGIC: Cranial nerves II through XII are intact. Muscle strength 5/5 in all extremities. Sensation intact. Gait not checked.  PSYCHIATRIC: The patient is alert and oriented x 3.  SKIN: No obvious rash, lesion, or ulcer.   DATA REVIEW:   CBC  Recent Labs  Lab 04/04/19 0225  WBC 11.1*  HGB 14.2  HCT 44.8  PLT 131*    Chemistries  Recent Labs  Lab 04/03/19 1201 04/04/19 0225  NA 140 138  K 4.3 4.0  CL 102 107  CO2 28 23  GLUCOSE 112* 109*  BUN 15 16  CREATININE 1.33* 1.25*  CALCIUM 8.9 8.6*  AST 28  --   ALT 10  --   ALKPHOS 88  --   BILITOT 1.0  --     Microbiology Results   Recent Results  (from the past 240 hour(s))  SARS Coronavirus 2 (CEPHEID - Performed in [facility] hospital lab), Hosp Order     Status: None   Collection Time: 04/03/19 12:00 PM  Result Value Ref Range Status   SARS Coronavirus 2 NEGATIVE NEGATIVE Final    Comment: (NOTE) If result is NEGATIVE SARS-CoV-2 target nucleic acids are NOT DETECTED. The SARS-CoV-2 RNA is generally detectable in upper and lower  respiratory specimens during the acute phase of infection. The lowest  concentration of SARS-CoV-2 viral copies this assay can detect is 250  copies / mL. A negative result does not preclude SARS-CoV-2 infection  and should not be used as the sole basis for treatment or other  patient management decisions.  A negative result may occur with  improper specimen collection / handling, submission of specimen other  than nasopharyngeal swab, presence of viral mutation(s) within the  areas targeted by this assay, and inadequate number of viral copies  (<250 copies / mL). A negative result must be combined with clinical  observations, patient history, and epidemiological information. If result is POSITIVE SARS-CoV-2 target nucleic acids are DETECTED. The SARS-CoV-2 RNA is generally detectable in upper and lower  respiratory specimens dur ing the acute phase of infection.  Positive  results are indicative of active infection with SARS-CoV-2.  Clinical  correlation with patient history and other diagnostic information is  necessary to determine patient infection status.  Positive results do  not rule out bacterial infection or co-infection with other viruses. If result is PRESUMPTIVE POSTIVE SARS-CoV-2 nucleic acids MAY BE PRESENT.   A presumptive positive result was obtained on the submitted specimen  and confirmed on repeat testing.  While 2019 novel coronavirus  (SARS-CoV-2) nucleic acids may be present in the submitted sample  additional confirmatory testing may be necessary for epidemiological  and /  or clinical management purposes  to differentiate between  SARS-CoV-2 and other Sarbecovirus currently known to infect humans.  If clinically indicated additional testing with an alternate test  methodology [phone]) is advised. The SARS-CoV-2 RNA is generally  detectable in upper and lower respiratory sp ecimens during the acute  phase of infection. The expected result is Negative. Fact Sheet for Patients:  BoilerBrush.com.cy Fact Sheet for Healthcare Providers: https://pope.com/ This test is not yet approved or cleared by the [place] FDA and has been authorized  for detection and/or diagnosis of SARS-CoV-2 by FDA under an Emergency Use Authorization (EUA).  This EUA will remain in effect (meaning this test can be used) for the duration of the COVID-19 declaration under Section 564(b)(1) of the Act, 21 U.S.C. section 360bbb-3(b)(1), unless the authorization is terminated or revoked sooner. Performed at [facility], [address] [place], [place] [address]   MRSA PCR Screening     Status: None   Collection Time: 04/04/19  8:07 AM  Result Value Ref Range Status   MRSA by PCR NEGATIVE NEGATIVE Final    Comment:        The GeneXpert MRSA Assay (FDA approved for NASAL specimens only), is one component of a comprehensive MRSA colonization surveillance program. It is not intended to diagnose MRSA infection nor to guide or monitor treatment for MRSA infections. Performed at [facility], [address] [place], [place] [address]     RADIOLOGY:  No results found.   CODE STATUS:     Code Status Orders  (From admission, onward)         Start     Ordered   04/03/19 1426  Full code  Continuous     04/03/19 1425        Code Status History    Date Active Date Inactive Code Status Order ID Comments User Context   08/15/2018 1535 08/16/2018 1508 Full Code [id number]  Ramonita LabGouru, Aruna, MD Inpatient       TOTAL TIME TAKING CARE OF THIS PATIENT: *40* minutes.    Enedina FinnerSona Kaytelyn Glore M.D on 04/05/2019 at 2:14 PM  Between 7am to 6pm - Pager - [phone] After 6pm go to [email or web address] - Social research officer, [email or web address] [email or web address]  [facility]  Office  [phone]  CC: Primary care physician; Hyman HopesBurns, Harriett P, MD

## 2019-04-05 NOTE — Consult Note (Signed)
Reason for Consult: STEMI inferior posterior Referring Physician: Myrene GalasHarriet Burns primary Dr. Roxan Hockeyobinson emergency room physician  Frank HartshornRobert L Jefferson is an 85 y.o. male.  HPI: Patient presents with stuttering chest pain over the last several days but particularly worse this morning midsternal shortness of breath some diaphoresis denies nausea vomiting.  He denies any previous cardiac history but with the persistent chest pain he called rescue and was brought in for further assessment.  On rescue squad EKG was sufficiently abnormal that they were concerned about a STEMI presentation.  EKG was confirmed in the emergency room patient was hemodynamically stable with EKG meeting STEMI criteria cardiology was called and the patient was then recommended to take directly to the cardiac Cath Lab.  Patient was treated with nitroglycerin and aspirin heparin in the emergency room.  Patient states the chest pain had somewhat improved while in the emergency room of about 6/10 really bad pain started about 3 hours prior to presentation.  Past Medical History:  Diagnosis Date  . Alcohol abuse   . Chronic kidney disease   . Hypertension     Past Surgical History:  Procedure Laterality Date  . CATARACT EXTRACTION W/PHACO Right 01/17/2018   Procedure: CATARACT EXTRACTION PHACO AND INTRAOCULAR LENS PLACEMENT (IOC) COMPLICATED RIGHT;  Surgeon: Lockie MolaBrasington, Chadwick, MD;  [facility];  Service: Ophthalmology;  Laterality: Right;  . CATARACT EXTRACTION W/PHACO Left 03/14/2018   Procedure: CATARACT EXTRACTION PHACO AND INTRAOCULAR LENS PLACEMENT (IOC) COMPLICATED LEFT;  Surgeon: Lockie MolaBrasington, Chadwick, MD;  [facility];  Service: Ophthalmology;  Laterality: Left;  . CORONARY/GRAFT ACUTE MI REVASCULARIZATION N/A 04/03/2019   Procedure: Coronary/Graft Acute MI Revascularization;  Surgeon: Alwyn Peaallwood, Dwayne D, MD;  Location: [facility] INVASIVE CV LAB;  Service: Cardiovascular;  Laterality: N/A;  . FOOT  SURGERY Right    metal plate  . LEFT HEART CATH AND CORONARY ANGIOGRAPHY N/A 04/03/2019   Procedure: LEFT HEART CATH AND CORONARY ANGIOGRAPHY;  Surgeon: Alwyn Peaallwood, Dwayne D, MD;  Location: [facility] INVASIVE CV LAB;  Service: Cardiovascular;  Laterality: N/A;    Family History  Problem Relation Age of Onset  . Hypertension Mother   . Cancer Mother   . CAD Mother   . Cancer Brother     Social History:  reports that he has never smoked. His smokeless tobacco use includes snuff. He reports current alcohol use. He reports that he does not use drugs.  Allergies:  Allergies  Allergen Reactions  . Ace Inhibitors Anaphylaxis  . Ciprofloxacin Anaphylaxis  . Penicillins Anaphylaxis    Medications: I have reviewed the patient's current medications.  Results for orders placed or performed during the hospital encounter of 04/03/19 (from the past 48 hour(s))  SARS Coronavirus 2 (CEPHEID - Performed in [facility]), Hosp Order     Status: None   Collection Time: 04/03/19 12:00 PM  Result Value Ref Range   SARS Coronavirus 2 NEGATIVE NEGATIVE    Comment: (NOTE) If result is NEGATIVE SARS-CoV-2 target nucleic acids are NOT DETECTED. The SARS-CoV-2 RNA is generally detectable in upper and lower  respiratory specimens during the acute phase of infection. The lowest  concentration of SARS-CoV-2 viral copies this assay can detect is 250  copies / mL. A negative result does not preclude SARS-CoV-2 infection  and should not be used as the sole basis for treatment or other  patient management decisions.  A negative result may occur with  improper specimen collection / handling, submission of specimen other  than nasopharyngeal swab,  presence of viral mutation(s) within the  areas targeted by this assay, and inadequate number of viral copies  (<250 copies / mL). A negative result must be combined with clinical  observations, patient history, and epidemiological information. If result is  POSITIVE SARS-CoV-2 target nucleic acids are DETECTED. The SARS-CoV-2 RNA is generally detectable in upper and lower  respiratory specimens dur ing the acute phase of infection.  Positive  results are indicative of active infection with SARS-CoV-2.  Clinical  correlation with patient history and other diagnostic information is  necessary to determine patient infection status.  Positive results do  not rule out bacterial infection or co-infection with other viruses. If result is PRESUMPTIVE POSTIVE SARS-CoV-2 nucleic acids MAY BE PRESENT.   A presumptive positive result was obtained on the submitted specimen  and confirmed on repeat testing.  While 2019 novel coronavirus  (SARS-CoV-2) nucleic acids may be present in the submitted sample  additional confirmatory testing may be necessary for epidemiological  and / or clinical management purposes  to differentiate between  SARS-CoV-2 and other Sarbecovirus currently known to infect humans.  If clinically indicated additional testing with an alternate test  methodology [phone]) is advised. The SARS-CoV-2 RNA is generally  detectable in upper and lower respiratory sp ecimens during the acute  phase of infection. The expected result is Negative. Fact Sheet for Patients:  BoilerBrush.com.cy Fact Sheet for Healthcare Providers: https://pope.com/ This test is not yet approved or cleared by the [place] FDA and has been authorized for detection and/or diagnosis of SARS-CoV-2 by FDA under an Emergency Use Authorization (EUA).  This EUA will remain in effect (meaning this test can be used) for the duration of the COVID-19 declaration under Section 564(b)(1) of the Act, 21 U.S.C. section 360bbb-3(b)(1), unless the authorization is terminated or revoked sooner. Performed at [facility], [address] [place], [place] [address]   CBC with Differential/Platelet     Status: Abnormal    Collection Time: 04/03/19 12:01 PM  Result Value Ref Range   WBC 9.4 4.0 - 10.5 K/uL   RBC 5.71 4.22 - 5.81 MIL/uL   Hemoglobin 14.9 13.0 - 17.0 g/dL   HCT 81.1 91.4 - 78.2 %   MCV 82.1 80.0 - 100.0 fL   MCH 26.1 26.0 - 34.0 pg   MCHC 31.8 30.0 - 36.0 g/dL   RDW 95.6 21.3 - 08.6 %   Platelets 129 (L) 150 - 400 K/uL   nRBC 0.0 0.0 - 0.2 %   Neutrophils Relative % 65 %   Neutro Abs 6.2 1.7 - 7.7 K/uL   Lymphocytes Relative 25 %   Lymphs Abs 2.3 0.7 - 4.0 K/uL   Monocytes Relative 7 %   Monocytes Absolute 0.6 0.1 - 1.0 K/uL   Eosinophils Relative 3 %   Eosinophils Absolute 0.3 0.0 - 0.5 K/uL   Basophils Relative 0 %   Basophils Absolute 0.0 0.0 - 0.1 K/uL   Immature Granulocytes 0 %   Abs Immature Granulocytes 0.02 0.00 - 0.07 K/uL    Comment: Performed at [facility], [address] [place], [place] [address]  Protime-INR     Status: None   Collection Time: 04/03/19 12:01 PM  Result Value Ref Range   Prothrombin Time 14.5 11.4 - 15.2 seconds   INR 1.1 0.8 - 1.2    Comment: (NOTE) INR goal varies based on device and disease states. Performed at [facility], [address], [place], [place] [address]  APTT     Status: None   Collection Time: 04/03/19 12:01 PM  Result Value Ref Range   aPTT 30 24 - 36 seconds    Comment: Performed at [facility], [address] [place], [place] [address]  Comprehensive metabolic panel     Status: Abnormal   Collection Time: 04/03/19 12:01 PM  Result Value Ref Range   Sodium 140 135 - 145 mmol/L   Potassium 4.3 3.5 - 5.1 mmol/L    Comment: HEMOLYSIS AT THIS LEVEL MAY AFFECT RESULT   Chloride 102 98 - 111 mmol/L   CO2 28 22 - 32 mmol/L   Glucose, Bld 112 (H) 70 - 99 mg/dL   BUN 15 8 - 23 mg/dL   Creatinine, Ser 6.04 (H) 0.61 - 1.24 mg/dL   Calcium 8.9 8.9 - 54.0 mg/dL   Total Protein 7.4 6.5 - 8.1 g/dL   Albumin 3.8 3.5 - 5.0 g/dL   AST 28 15 - 41 U/L    Comment: HEMOLYSIS AT THIS LEVEL MAY AFFECT  RESULT   ALT 10 0 - 44 U/L   Alkaline Phosphatase 88 38 - 126 U/L   Total Bilirubin 1.0 0.3 - 1.2 mg/dL    Comment: HEMOLYSIS AT THIS LEVEL MAY AFFECT RESULT   GFR calc non Af Amer 48 (L) >60 mL/min   GFR calc Af Amer 56 (L) >60 mL/min   Anion gap 10 5 - 15    Comment: Performed at [facility], [address] [place], [place] [address]  Troponin I - ONCE - STAT     Status: Abnormal   Collection Time: 04/03/19 12:01 PM  Result Value Ref Range   Troponin I 1.11 (HH) <0.03 ng/mL    Comment: CRITICAL RESULT CALLED TO, READ BACK BY AND VERIFIED WITH MARY ANNE,RN AT 1245 ON 04/03/19 KLM Performed at [facility], [address] [place], [place] [address]   Lipid panel     Status: Abnormal   Collection Time: 04/03/19 12:01 PM  Result Value Ref Range   Cholesterol 283 (H) 0 - 200 mg/dL   Triglycerides 956 (H) <150 mg/dL   HDL 49 >21 mg/dL   Total CHOL/HDL Ratio 5.8 RATIO   VLDL 45 (H) 0 - 40 mg/dL   LDL Cholesterol 308 (H) 0 - 99 mg/dL    Comment:        Total Cholesterol/HDL:CHD Risk Coronary Heart Disease Risk Table                     Men   Women  1/2 Average Risk   3.4   3.3  Average Risk       5.0   4.4  2 X Average Risk   9.6   7.1  3 X Average Risk  23.4   11.0        Use the calculated Patient Ratio above and the CHD Risk Table to determine the patient's CHD Risk.        ATP III CLASSIFICATION (LDL):  <100     mg/dL   Optimal  657-846  mg/dL   Near or Above                    Optimal  130-159  mg/dL   Borderline  962-952  mg/dL   High  >841     mg/dL   Very High Performed at [facility], [address], [place], [place] [address]   Hemoglobin  A1c     Status: None   Collection Time: 04/03/19 12:01 PM  Result Value Ref Range   Hgb A1c MFr Bld 5.4 4.8 - 5.6 %    Comment: (NOTE) Pre diabetes:          5.7%-6.4% Diabetes:              >6.4% Glycemic control for   <7.0% adults with diabetes    Mean Plasma Glucose 108.28 mg/dL     Comment: Performed at [facility], [address] [place], [place] [address]  POCT Activated clotting time     Status: None   Collection Time: 04/03/19 12:51 PM  Result Value Ref Range   Activated Clotting Time 577 seconds  Glucose, capillary     Status: Abnormal   Collection Time: 04/03/19  2:27 PM  Result Value Ref Range   Glucose-Capillary 103 (H) 70 - 99 mg/dL  Troponin I - Now Then Q6H     Status: Abnormal   Collection Time: 04/03/19  2:34 PM  Result Value Ref Range   Troponin I 9.58 (HH) <0.03 ng/mL    Comment: CRITICAL RESULT CALLED TO, READ BACK BY AND VERIFIED WITH SABRINA ELLIOTT AT 1500 ON 04/03/19 KLM Performed at [facility], [address] [place], [place] [address]   Troponin I - Now Then Q6H     Status: Abnormal   Collection Time: 04/03/19  6:04 PM  Result Value Ref Range   Troponin I 49.27 (HH) <0.03 ng/mL    Comment: CRITICAL RESULT CALLED TO, READ BACK BY AND VERIFIED WITH MEGAN [facility] 04/03/19 1926 KLW Performed at [facility], [address] [place], [place] [address]   Hemoglobin and hematocrit, blood     Status: None   Collection Time: 04/03/19  6:04 PM  Result Value Ref Range   Hemoglobin 15.1 13.0 - 17.0 g/dL   HCT 77.0 34.0 - 35.2 %    Comment: Performed at [facility], [address] [place], [place] [address]  Troponin I - Now Then Q6H     Status: Abnormal   Collection Time: 04/04/19  2:25 AM  Result Value Ref Range   Troponin I 41.21 (HH) <0.03 ng/mL    Comment: CRITICAL VALUE NOTED. VALUE IS CONSISTENT WITH PREVIOUSLY REPORTED/CALLED VALUE  SDR Performed at [facility], [address] [place], [place] [address]   Basic metabolic panel     Status: Abnormal   Collection Time: 04/04/19  2:25 AM  Result Value Ref Range   Sodium 138 135 - 145 mmol/L   Potassium 4.0 3.5 - 5.1 mmol/L   Chloride 107 98 - 111 mmol/L   CO2 23 22 - 32 mmol/L   Glucose, Bld 109 (H) 70 - 99 mg/dL   BUN 16 8 - 23  mg/dL   Creatinine, Ser 1.21 (H) 0.61 - 1.24 mg/dL   Calcium 8.6 (L) 8.9 - 10.3 mg/dL   GFR calc non Af Amer 52 (L) >60 mL/min   GFR calc Af Amer >60 >60 mL/min   Anion gap 8 5 - 15    Comment: Performed at [facility], [address] [place], [place] [address]  CBC with Differential/Platelet     Status: Abnormal   Collection Time: 04/04/19  2:25 AM  Result Value Ref Range   WBC 11.1 (H) 4.0 - 10.5 K/uL   RBC 5.50 4.22 - 5.81 MIL/uL   Hemoglobin 14.2 13.0 - 17.0 g/dL   HCT 44.8  39.0 - 52.0 %   MCV 81.5 80.0 - 100.0 fL   MCH 25.8 (L) 26.0 - 34.0 pg   MCHC 31.7 30.0 - 36.0 g/dL   RDW 40.9 81.1 - 91.4 %   Platelets 131 (L) 150 - 400 K/uL   nRBC 0.0 0.0 - 0.2 %   Neutrophils Relative % 69 %   Neutro Abs 7.8 (H) 1.7 - 7.7 K/uL   Lymphocytes Relative 18 %   Lymphs Abs 2.0 0.7 - 4.0 K/uL   Monocytes Relative 10 %   Monocytes Absolute 1.1 (H) 0.1 - 1.0 K/uL   Eosinophils Relative 1 %   Eosinophils Absolute 0.2 0.0 - 0.5 K/uL   Basophils Relative 1 %   Basophils Absolute 0.1 0.0 - 0.1 K/uL   Immature Granulocytes 1 %   Abs Immature Granulocytes 0.05 0.00 - 0.07 K/uL    Comment: Performed at [facility], [address] [place], [place] [address]  MRSA PCR Screening     Status: None   Collection Time: 04/04/19  8:07 AM  Result Value Ref Range   MRSA by PCR NEGATIVE NEGATIVE    Comment:        The GeneXpert MRSA Assay (FDA approved for NASAL specimens only), is one component of a comprehensive MRSA colonization surveillance program. It is not intended to diagnose MRSA infection nor to guide or monitor treatment for MRSA infections. Performed at [facility], [address] [place], [place] [address]     Dg Chest Port 1 View  Result Date: 04/03/2019 CLINICAL DATA:  Chest pain EXAM: PORTABLE CHEST 1 VIEW COMPARISON:  No 08/15/2018 FINDINGS: Normal heart size. Aortic atherosclerosis. No pleural effusion. Pulmonary vascular congestion without  Frank edema. Decreased lung volumes with bibasilar atelectasis. IMPRESSION: 1. Low lung volumes with bibasilar atelectasis and pulmonary vascular congestion. 2.  Aortic Atherosclerosis (ICD10-I70.0). Electronically Signed   By: Signa Kell M.D.   On: 04/03/2019 12:16    Review of Systems  Constitutional: Positive for diaphoresis and malaise/fatigue.  HENT: Negative.   Eyes: Negative.   Respiratory: Positive for shortness of breath.   Cardiovascular: Positive for chest pain.  Gastrointestinal: Positive for heartburn.  Genitourinary: Negative.   Musculoskeletal: Negative.   Skin: Negative.   Neurological: Positive for tingling, weakness and headaches.  Endo/Heme/Allergies: Negative.   Psychiatric/Behavioral: Positive for substance abuse. The patient has insomnia.    Blood pressure (!) 118/57, pulse 65, temperature 98.6 F (37 C), temperature source Oral, resp. rate 18, height  (1.88 m), weight 64.5 kg, SpO2 96 %. Physical Exam  Nursing note and vitals reviewed. Constitutional: He is oriented to person, place, and time. He appears well-developed and well-nourished.  HENT:  Head: Normocephalic and atraumatic.  Eyes: Pupils are equal, round, and reactive to light. Conjunctivae and EOM are normal.  Neck: Normal range of motion. Neck supple.  Cardiovascular: Normal rate and regular rhythm. Exam reveals gallop.  Respiratory: Effort normal and breath sounds normal.  GI: Soft. Bowel sounds are normal.  Musculoskeletal: Normal range of motion.  Neurological: He is alert and oriented to person, place, and time. He has normal reflexes.  Skin: Skin is warm and dry.  Psychiatric: He has a normal mood and affect.    Assessment/Plan: STEMI acute inferior posterior Abnormal EKG Hypertension Hyperlipidemia Chronic renal insufficiency Alcohol abuse . Plan Recommend emergent cardiac cath possible PCI and stent Aggressive hypertension control Recommend hyperlipidemia with  Lipitor Aspirin Brilinta Follow-up renal function consider nephrology input Beta-blocker amlodipine avoid  ACE inhibitor because of allergy Echocardiogram for further assessment evaluation Based further recommendation based on cardiac cath  Dwayne D Callwood 04/05/2019, 12:26 AM

## 2019-04-05 NOTE — Progress Notes (Signed)
Talked to Normand Sloop and Joycelyn Rua and updated them about the discharge instructions including new medications and follow-up appointment. Per CM [facility] will be here to pick up patient at around 3:30 and will also go over discharge instructions with them.

## 2019-04-05 NOTE — Progress Notes (Signed)
PHARMACIST - PHYSICIAN COMMUNICATION  DR:   Enedina Finner  CONCERNING: IV to Oral Route Change Policy  RECOMMENDATION: This patient is receiving Thiamine by the intravenous route.  Based on criteria approved by the Pharmacy and Therapeutics Committee, the intravenous medication(s) is/are being converted to the equivalent oral dose form(s).   DESCRIPTION: These criteria include:  The patient is eating (either orally or via tube) and/or has been taking other orally administered medications for a least 24 hours  The patient has no evidence of active gastrointestinal bleeding or impaired GI absorption (gastrectomy, short bowel, patient on TNA or NPO).  If you have questions about this conversion, please contact the Pharmacy Department  []   [phone] )  [facility] [x]   [phone] )  [facility] []   [phone] )  [facility] []   [phone] )  [facility] []   [phone] )  [facility]   Albina Billet, PharmD, BCPS Clinical Pharmacist 04/05/2019 10:26 AM

## 2019-04-05 NOTE — Plan of Care (Signed)
  Problem: Safety: Goal: Ability to remain free from injury will improve Outcome: Progressing   Problem: Activity: Goal: Ability to return to baseline activity level will improve Outcome: Progressing Note:  Up with standby assist & a cane to the bathroom, tolerating well   Problem: Cardiovascular: Goal: Vascular access site(s) Level 0-1 will be maintained Outcome: Progressing Note:  Right groin site at a level 1- post cath hematoma stable, slightly bruised with some edema    Problem: Education: Goal: Knowledge of General Education information will improve Description Including pain rating scale, medication(s)/side effects and non-pharmacologic comfort measures Outcome: Completed/Met   Problem: Pain Managment: Goal: General experience of comfort will improve Outcome: Completed/Met Note:  No complaints of pain this shift

## 2019-04-05 NOTE — Progress Notes (Signed)
Subjective:  Patient much improved no further chest pain no groin issues no bleeding no nausea vomiting shortness of breath is improved leaning in the ICU resting comfortably hemodynamically stable  Objective:  Vital Signs in the last 24 hours: Temp:  [97.6 F (36.4 C)-98.6 F (37 C)] 98.6 F (37 C) (05/28 1923) Pulse Rate:  [52-68] 65 (05/28 1923) Resp:  [12-25] 18 (05/28 1923) BP: (99-141)/(57-88) 118/57 (05/28 1923) SpO2:  [95 %-100 %] 96 % (05/28 1923)  Intake/Output from previous day: 05/28 0701 - 05/29 0700 In: 762.6 [P.O.:707; I.V.:3; IV Piggyback:52.6] Out: 250 [Urine:250] Intake/Output from this shift: Total I/O In: 123 [P.O.:120; I.V.:3] Out: 125 [Urine:125]  Physical Exam: General appearance: appears stated age Neck: no adenopathy, no carotid bruit, no JVD, supple, symmetrical, trachea midline and thyroid not enlarged, symmetric, no tenderness/mass/nodules Lungs: clear to auscultation bilaterally Heart: regular rate and rhythm, S1, S2 normal, no murmur, click, rub or gallop Abdomen: soft, non-tender; bowel sounds normal; no masses,  no organomegaly Extremities: extremities normal, atraumatic, no cyanosis or edema Pulses: 2+ and symmetric Skin: Skin color, texture, turgor normal. No rashes or lesions Neurologic: Alert and oriented X 3, normal strength and tone. Normal symmetric reflexes. Normal coordination and gait  Lab Results: Recent Labs    04/03/19 1201 04/03/19 1804 04/04/19 0225  WBC 9.4  --  11.1*  HGB 14.9 15.1 14.2  PLT 129*  --  131*   Recent Labs    04/03/19 1201 04/04/19 0225  NA 140 138  K 4.3 4.0  CL 102 107  CO2 28 23  GLUCOSE 112* 109*  BUN 15 16  CREATININE 1.33* 1.25*   Recent Labs    04/03/19 1804 04/04/19 0225  TROPONINI 49.27* 41.21*   Hepatic Function Panel Recent Labs    04/03/19 1201  PROT 7.4  ALBUMIN 3.8  AST 28  ALT 10  ALKPHOS 88  BILITOT 1.0   Recent Labs    04/03/19 1201  CHOL 283*   No results for  input(s): PROTIME in the last 72 hours.  Imaging: Imaging results have been reviewed  Cardiac Studies:  Assessment/Plan:  STEMI inferior posterior postop day 1 Status post PCI and stent with DES to circumflex Poorly controlled hypertension Angina Chest Pain  Hyperlipidemia Chronic renal insufficiency Elevated troponins . Plan Patient is doing well post procedure would recommend transfer to telemetry Increase activity to include ambulating in the halls Continue blood pressure control will switch metoprolol to 25 mg twice a day because of mild bradycardia Reduce amlodipine to 5 mg once a day Continue aspirin Brilinta for at least 1 year Proceed with echocardiogram for assessment of left ventricular function Advance diet Consider nephrology input for renal sufficiency Continue aggressive high statin dose therapy for hyperlipidemia P troponins on the way down from a high of around 50  LOS: 2 days     D  04/05/2019, 12:38 AM

## 2019-04-05 NOTE — TOC Transition Note (Addendum)
Transition of Care [facility]) - CM/SW Discharge Note   Patient Details  Name: Frank Jefferson MRN: [id number] Date of Birth: 26-Dec-1930  [facility] CM/SW Contact:  Sherren Kerns, RN Phone Number: 04/05/2019, 1:39 PM   Clinical Narrative:   Discharge order has been placed.  Patient is from [facility].   Left message with [name] at [facility]-[phone] to notify her that patient has a discharge order.  COVID negative yesterday at 12pm.  It was handed off that he will need a COVID the day of discharge.    14:40-Referral to [facility] for home health RN, PT.      Final next level of care: Assisted Living Barriers to Discharge: No Barriers Identified   Patient Goals and CMS Choice        Discharge Placement                       Discharge Plan and Services   Discharge Planning Services: CM Consult                                 Social Determinants of Health (SDOH) Interventions     Readmission Risk Interventions No flowsheet data found.

## 2019-04-05 NOTE — Progress Notes (Signed)
[facility] representative [name] if here to pick up patient, went over discharge instructions including medications and follow-up appointment. Provide discharge paperwork. Discontinue peripheral IV and telemetry monitor.

## 2019-04-22 ENCOUNTER — Ambulatory Visit: Payer: Medicare Other | Admitting: Urology

## 2019-06-10 ENCOUNTER — Ambulatory Visit: Payer: Medicare Other | Admitting: Urology

## 2020-04-02 ENCOUNTER — Encounter: Payer: Self-pay | Admitting: Emergency Medicine

## 2020-04-02 ENCOUNTER — Emergency Department
Admission: EM | Admit: 2020-04-02 | Discharge: 2020-04-02 | Disposition: A | Discharge status: Home / Self Care | Payer: Medicare Other | Attending: Emergency Medicine | Admitting: Emergency Medicine

## 2020-04-02 ENCOUNTER — Other Ambulatory Visit: Payer: Self-pay

## 2020-04-02 ENCOUNTER — Emergency Department: Payer: Medicare Other

## 2020-04-02 DIAGNOSIS — M79671 Pain in right foot: Secondary | ICD-10-CM | POA: Diagnosis present

## 2020-04-02 DIAGNOSIS — Z7982 Long term (current) use of aspirin: Secondary | ICD-10-CM | POA: Diagnosis not present

## 2020-04-02 DIAGNOSIS — Z79899 Other long term (current) drug therapy: Secondary | ICD-10-CM | POA: Diagnosis not present

## 2020-04-02 MED ORDER — TRAMADOL HCL 50 MG PO TABS: 50.0000 mg | ORAL_TABLET | Freq: Three times a day (TID) | ORAL | 0 refills | Status: DC | PRN

## 2020-04-02 MED ORDER — HYDROCODONE-ACETAMINOPHEN 5-325 MG PO TABS
1.0000 | ORAL_TABLET | Freq: Once | ORAL | Status: AC
  Administered 2020-04-02: 1 via ORAL
  Filled 2020-04-02: qty 1

## 2020-04-02 NOTE — ED Notes (Signed)
Ambulates to bathroom with cane  Tolerated well

## 2020-04-02 NOTE — ED Provider Notes (Signed)
[facility] Emergency Department Provider Note   ____________________________________________   First MD Initiated Contact with Patient 04/02/20 [id number]     (approximate)  I have reviewed the triage vital signs and the nursing notes.   HISTORY  Chief Complaint Foot Pain   HPI Frank Jefferson is a 86 y.o. male presents to the ED via EMS from [facility].  Patient complains of his right foot hurting for the last several days.  He is on aware of any recent injury.  He states that he had surgery on his foot years ago due to an injury.  Patient does not take any regular medication for his foot.  EMS reports that patient was ambulatory when they arrived.       Past Medical History:  Diagnosis Date  . Alcohol abuse   . Chronic kidney disease   . Hypertension     Patient Active Problem List   Diagnosis Date Noted  . STEMI (ST elevation myocardial infarction) (HCC) 04/03/2019  . AKI (acute kidney injury) (HCC) 08/15/2018    Past Surgical History:  Procedure Laterality Date  . CATARACT EXTRACTION W/PHACO Right 01/17/2018   Procedure: CATARACT EXTRACTION PHACO AND INTRAOCULAR LENS PLACEMENT (IOC) COMPLICATED RIGHT;  Surgeon: Lockie Mola, MD;  Location: [facility];  Service: Ophthalmology;  Laterality: Right;  . CATARACT EXTRACTION W/PHACO Left 03/14/2018   Procedure: CATARACT EXTRACTION PHACO AND INTRAOCULAR LENS PLACEMENT (IOC) COMPLICATED LEFT;  Surgeon: Lockie Mola, MD;  Location: [facility];  Service: Ophthalmology;  Laterality: Left;  . CORONARY/GRAFT ACUTE MI REVASCULARIZATION N/A 04/03/2019   Procedure: Coronary/Graft Acute MI Revascularization;  Surgeon: Alwyn Pea, MD;  Location: [facility];  Service: Cardiovascular;  Laterality: N/A;  . FOOT SURGERY Right    metal plate  . LEFT HEART CATH AND CORONARY ANGIOGRAPHY N/A 04/03/2019   Procedure: LEFT HEART CATH AND CORONARY ANGIOGRAPHY;   Surgeon: Alwyn Pea, MD;  Location: [facility];  Service: Cardiovascular;  Laterality: N/A;    Prior to Admission medications   Medication Sig Start Date End Date Taking? Authorizing Provider  acetaminophen (TYLENOL) 325 MG tablet Take 650 mg by mouth every 4 (four) hours as needed for fever.    [provider]  alum & mag hydroxide-simeth (MINTOX) 200-200-20 MG/5ML suspension Take 30 mLs by mouth daily as needed for indigestion or heartburn.    [provider]  amLODipine (NORVASC) 5 MG tablet Take 1 tablet (5 mg total) by mouth daily. 04/06/19   Enedina Finner, MD  aspirin 81 MG chewable tablet Chew 1 tablet (81 mg total) by mouth daily. 04/06/19   Enedina Finner, MD  atorvastatin (LIPITOR) 80 MG tablet Take 1 tablet (80 mg total) by mouth daily at 6 PM. 04/05/19   Enedina Finner, MD  bismuth subsalicylate (PEPTO BISMOL) 262 MG/15ML suspension Take 15 mLs by mouth every 2 (two) hours as needed for indigestion (max 6 doses in 24 hours).    [provider]  dicyclomine (BENTYL) 10 MG capsule Take 10 mg by mouth 4 (four) times daily.    [provider]  folic acid (FOLVITE) 1 MG tablet Take 1 mg by mouth daily.    [provider]  loperamide (IMODIUM) 2 MG capsule Take 2 mg by mouth as needed for diarrhea or loose stools (max 4 doses).    [provider]  LORazepam (ATIVAN) 0.5 MG tablet Take 0.5 mg by mouth 2 (two) times daily as needed  for anxiety.    [provider]  magnesium hydroxide (MILK OF MAGNESIA) 400 MG/5ML suspension Take 30 mLs by mouth daily as needed for mild constipation.    [provider]  magnesium oxide (MAG-OX) 400 MG tablet Take 400 mg by mouth 3 (three) times daily.    [provider]  Menthol (HONEY LEMON COUGH DROPS MT) Use as directed 1 tablet in the mouth or throat every 3 (three) hours as needed (cough).    [provider]  metoprolol tartrate (LOPRESSOR) 25 MG tablet Take 1  tablet (25 mg total) by mouth 2 (two) times daily. 04/05/19   Fritzi Mandes, MD  Pseudoephedrine-DM-GG (ROBITUSSIN CF PO) Take 10 mLs by mouth every 6 (six) hours as needed (cough/congestion).    [provider]  psyllium (METAMUCIL) 58.6 % powder Take 1 packet by mouth 2 (two) times a day.    [provider]  Skin Protectants, Misc. (BAZA PROTECT EX) Apply 1 application topically 2 (two) times daily as needed (skin protection).    [provider]  sodium phosphate (FLEET) 7-19 GM/118ML ENEM Place 1 enema rectally daily as needed for severe constipation (not relieved by milk of mag and prune juice).    [provider]  thiamine (VITAMIN B-1) 100 MG tablet Take 100 mg by mouth daily.    [provider]  ticagrelor (BRILINTA) 90 MG TABS tablet Take 1 tablet (90 mg total) by mouth 2 (two) times daily. 04/05/19   Fritzi Mandes, MD  traMADol (ULTRAM) 50 MG tablet Take 1 tablet (50 mg total) by mouth every 8 (eight) hours as needed for moderate pain (right foot). 04/02/20   Johnn Hai, PA-C    Allergies Ace inhibitors, Ciprofloxacin, and Penicillins  Family History  Problem Relation Age of Onset  . Hypertension Mother   . Cancer Mother   . CAD Mother   . Cancer Brother     Social History Social History   Tobacco Use  . Smoking status: Never Smoker  . Smokeless tobacco: Current User    Types: Snuff  Substance Use Topics  . Alcohol use: Yes    Comment: "1 40oz lasts all week"  . Drug use: No    Review of Systems Constitutional: No fever/chills Cardiovascular: Denies chest pain. Respiratory: Denies shortness of breath. Musculoskeletal: Positive for right foot pain. Skin: Negative for rash. Neurological: Negative for  focal weakness or numbness. ____________________________________________   PHYSICAL EXAM:  VITAL SIGNS: ED Triage Vitals [04/02/20 0808]  Enc Vitals Group     BP      Pulse      Resp      Temp      Temp src       SpO2      Weight 143 lb 4.8 oz (65 kg)     Height 6\' 2"  (1.88 m)     Head Circumference      Peak Flow      Pain Score      Pain Loc      Pain Edu?      Excl. in [place]?     Constitutional: Alert and oriented. Well appearing and in no acute distress. Eyes: Conjunctivae are normal.  Head: Atraumatic. Neck: No stridor.   Cardiovascular: Normal rate, regular rhythm. Grossly normal heart sounds.  Good peripheral circulation. Respiratory: Normal respiratory effort.  No retractions. Lungs CTAB. Musculoskeletal: There is no evidence of recent injury to the right foot or ankle.  There are  chronic  changes and healed scars secondary to past surgery.  Skin is intact.  Patient is able to move digits distally.  Motor sensory function intact.  Pulses are present. Neurologic:  Normal speech and language. No gross focal neurologic deficits are appreciated.  Skin:  Skin is warm, dry and intact.  Psychiatric: Mood and affect are normal. Speech and behavior are normal.  ____________________________________________   LABS (all labs ordered are listed, but only abnormal results are displayed)  Labs Reviewed - No data to display  RADIOLOGY   Official radiology report(s): DG Foot Complete Right  Result Date: 04/02/2020 CLINICAL DATA:  Pain without recent injury, history of open reduction in the past RIGHT foot pain for a few days. Along the medial aspect of the foot. EXAM: RIGHT FOOT COMPLETE - 3+ VIEW COMPARISON:  RIGHT ankle of 2010. FINDINGS: Status post ORIF of the RIGHT ankle, partially evaluated with plate and screw fixation along the medial aspect of the tibia extending to medial malleolus. Healed fracture of the distal fibula also partially imaged. Posttraumatic deformity of the second through the fifth metatarsals greatest in the fifth metatarsal. Bony fusion in the midfoot involving tarsometatarsal joints likely of third through fifth metatarsals at the base and potential bony fusion across the  metatarsals at the fourth and fifth metatarsal at the base. No sign of acute fracture. No ankle effusion. No soft tissue swelling over the dorsum of the foot. IMPRESSION: 1. Status post ORIF of the RIGHT ankle, partially imaged. 2. Posttraumatic deformity of the second through the fifth metatarsals. 3. Bony fusion in the midfoot involving tarsometatarsal joints and potential fusion across the fourth and fifth metatarsals at the base also related to prior trauma. 4. No sign of acute fracture or dislocation. No soft tissue swelling. Electronically Signed   By: Donzetta Kohut M.D.   On: 04/02/2020 08:46    ____________________________________________   PROCEDURES  Procedure(s) performed (including Critical Care):  Procedures   ____________________________________________   INITIAL IMPRESSION / ASSESSMENT AND PLAN / ED COURSE  As part of my medical decision making, I reviewed the following data within the electronic MEDICAL RECORD NUMBER Notes from prior ED visits and [place] Controlled Substance Database   86 year old male presents to the ED from assisted living with complaint of right foot pain.  Patient has a history of chronic foot pain secondary to past surgeries.  Patient currently is not taking any over-the-counter medication or prescribed pain medication for his foot.  He no longer follows up with his orthopedic surgeon.  Patient denies any recent injury.  X-rays were negative for any acute changes but did show internal hardware and degenerative changes.  Patient was able to weight-bear prior to discharge.  ____________________________________________   FINAL CLINICAL IMPRESSION(S) / ED DIAGNOSES  Final diagnoses:  Foot pain, right     ED Discharge Orders         Ordered    traMADol (ULTRAM) 50 MG tablet  Every 8 hours PRN     04/02/20 0951           Note:  This document was prepared using Dragon voice recognition software and may include unintentional dictation errors.      Tommi Rumps, PA-C 04/02/20 1552    Concha Se, MD 04/02/20 1723

## 2020-04-02 NOTE — ED Triage Notes (Signed)
Presents via EMS from [facility]  Presents with right foot pain   Pain started couple of days ago  W/o injury

## 2020-04-02 NOTE — Discharge Instructions (Signed)
Follow-up with your primary care provider if any continued problems with your right foot.  A prescription for tramadol was sent to the pharmacy if needed for pain.  This is to be taken with food every 8 hours if needed for pain.  Continue using your cane for added support and protection.

## 2021-11-08 IMAGING — DX DG FOOT COMPLETE 3+V*R*
3 series · 3 of 3 positions shown · non-contrast
Comparison: RIGHT ankle of 4646.

CLINICAL DATA: Pain without recent injury, history of open
reduction in the past RIGHT foot pain for a few days. Along the
medial aspect of the foot.

EXAM:
RIGHT FOOT COMPLETE - 3+ VIEW

[foot ap]
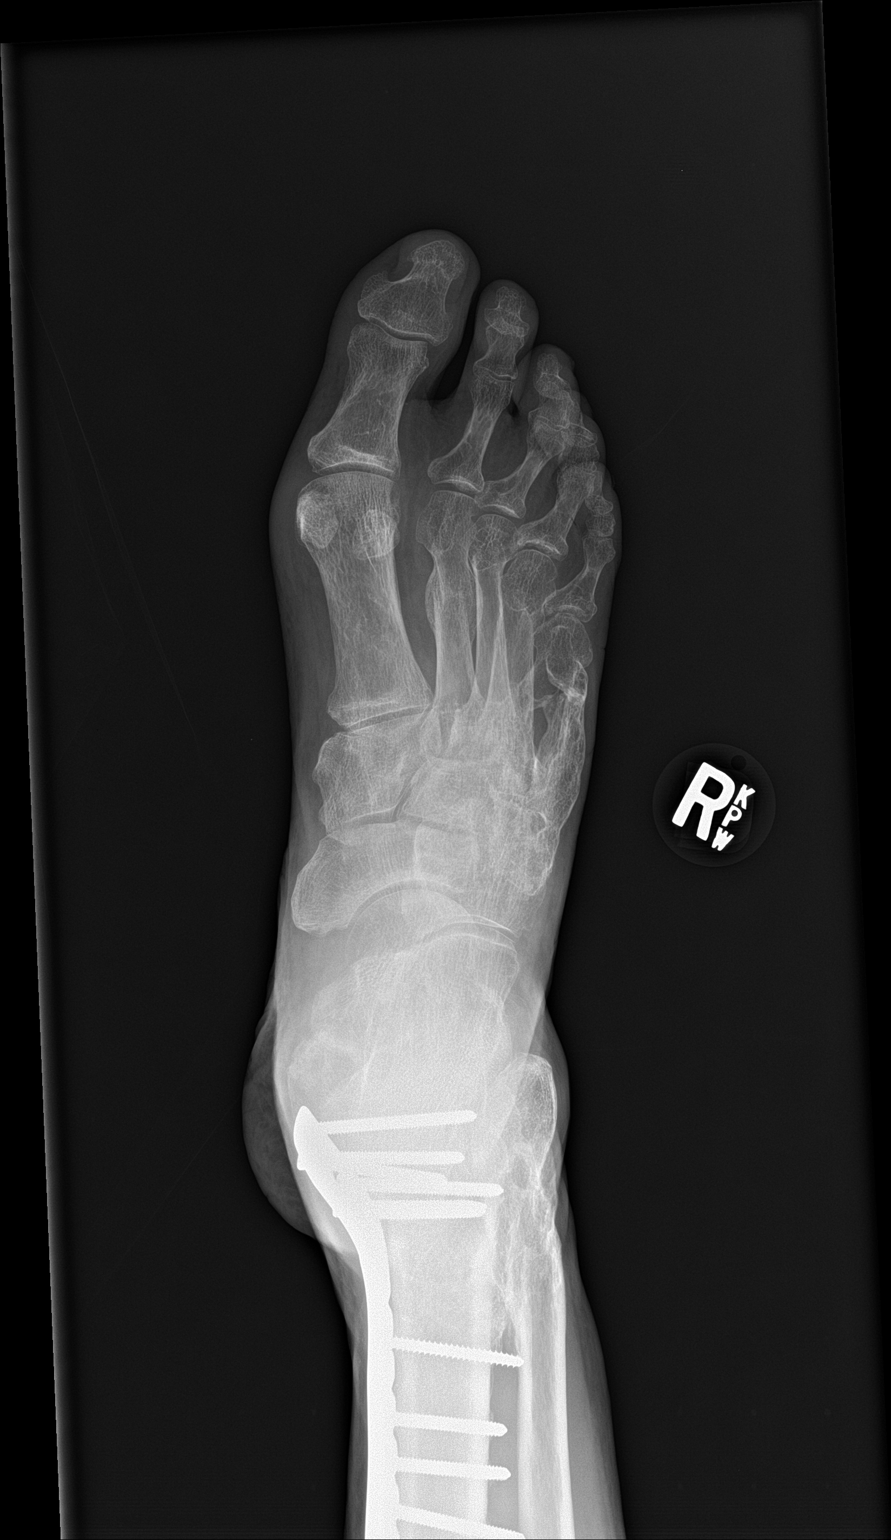

[foot obl]
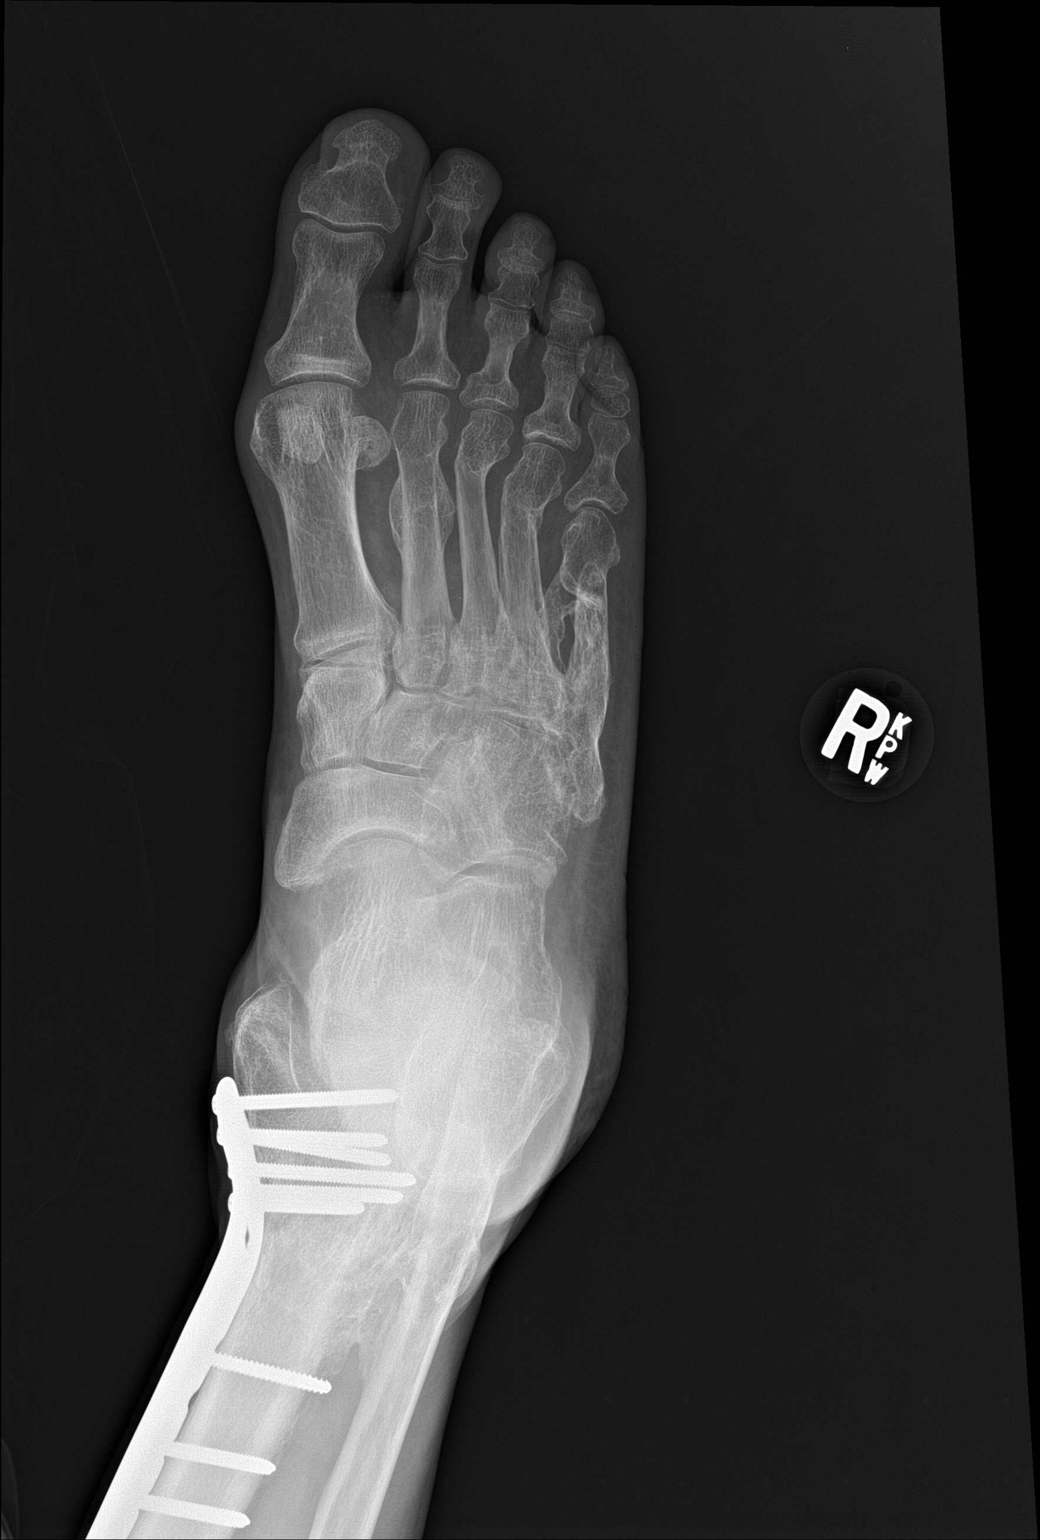

[foot lat]
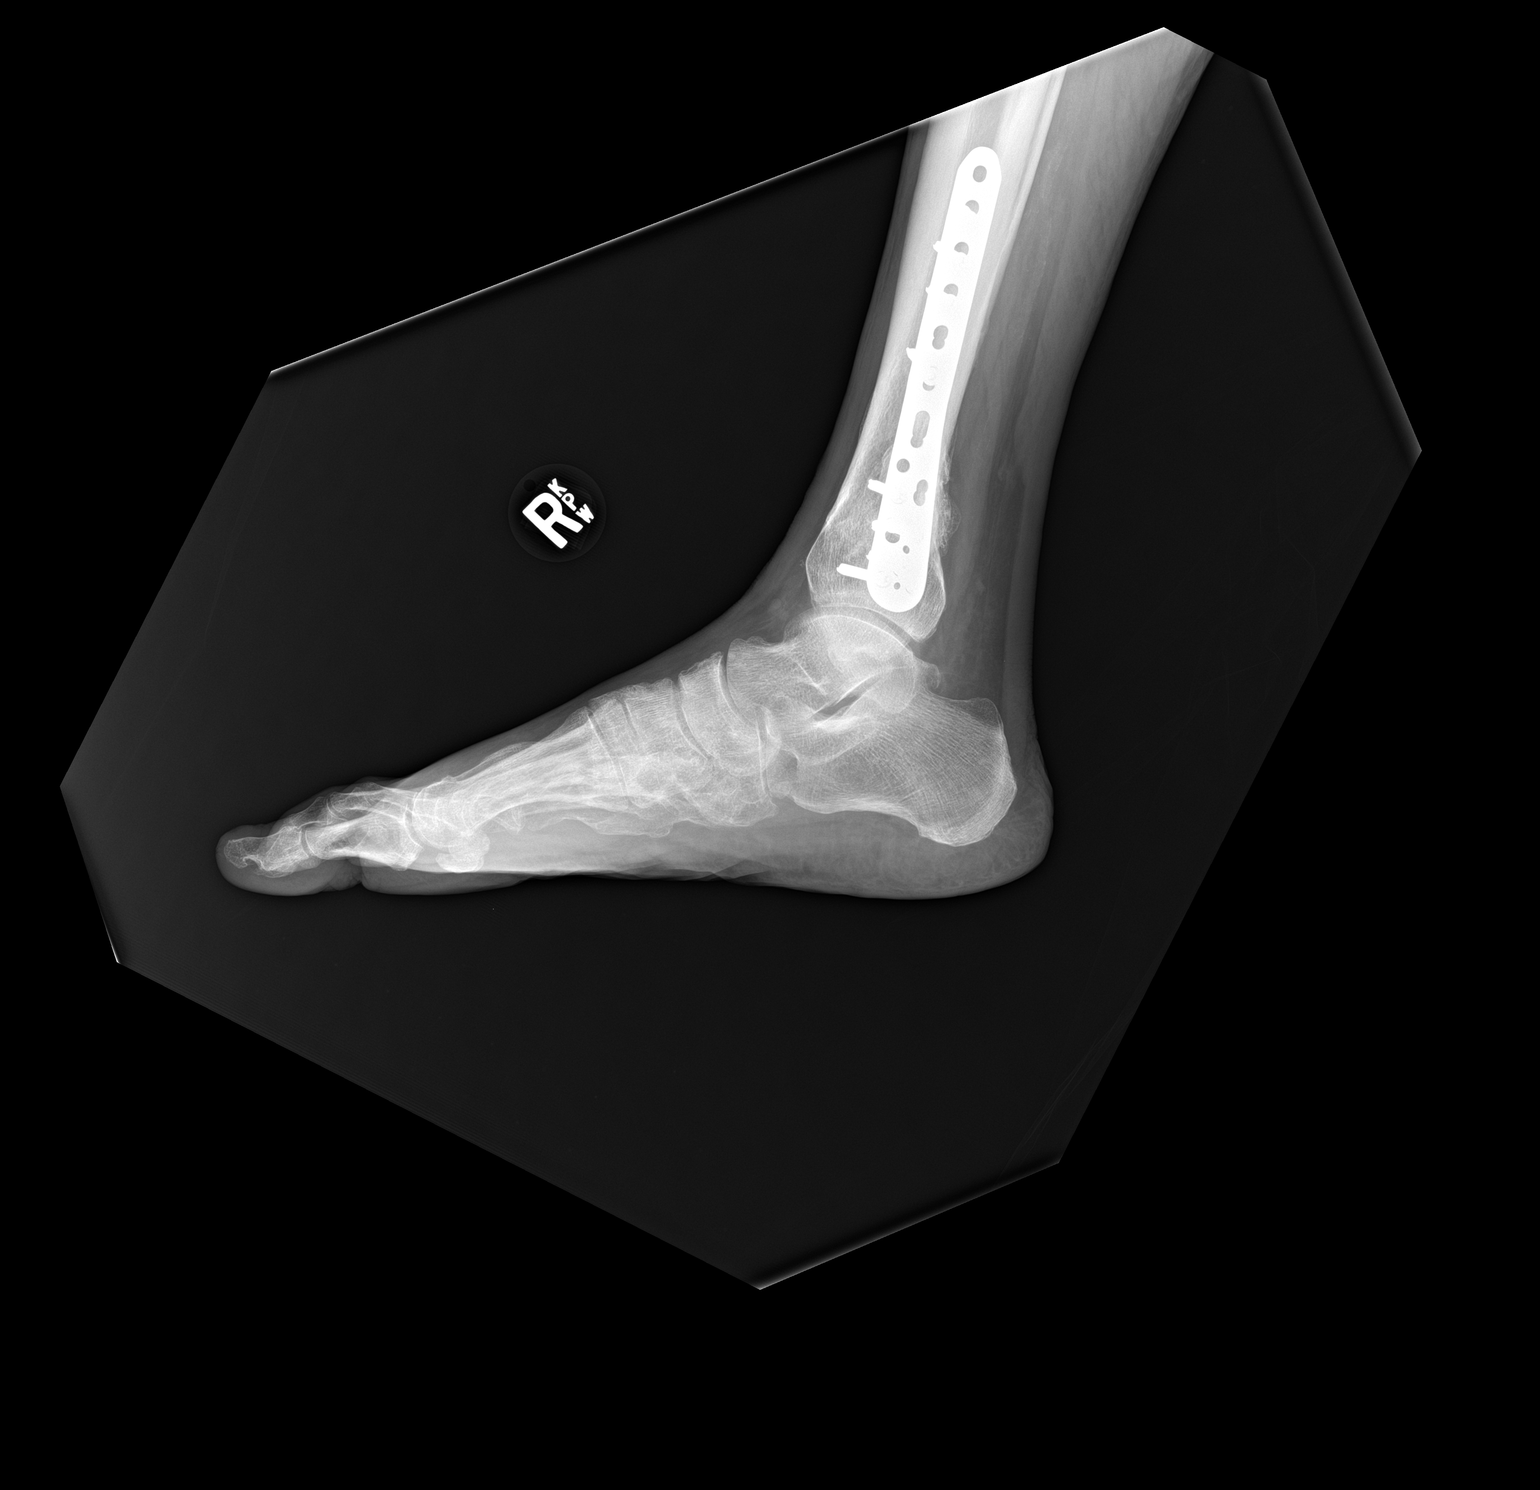

[3 of 3 positions shown; findings below may reference images not displayed]

FINDINGS: Status post ORIF of the RIGHT ankle, partially evaluated with plate
and screw fixation along the medial aspect of the tibia extending to
medial malleolus.

Healed fracture of the distal fibula also partially imaged.

Posttraumatic deformity of the second through the fifth metatarsals
greatest in the fifth metatarsal. Bony fusion in the midfoot
involving tarsometatarsal joints likely of third through fifth
metatarsals at the base and potential bony fusion across the
metatarsals at the fourth and fifth metatarsal at the base. No sign
of acute fracture. No ankle effusion. No soft tissue swelling over
the dorsum of the foot.
IMPRESSION: 1. Status post ORIF of the RIGHT ankle, partially imaged.
2. Posttraumatic deformity of the second through the fifth
metatarsals.
3. Bony fusion in the midfoot involving tarsometatarsal joints and
potential fusion across the fourth and fifth metatarsals at the base
also related to prior trauma.
4. No sign of acute fracture or dislocation. No soft tissue
swelling.

## 2021-11-21 ENCOUNTER — Emergency Department: Payer: Medicare Other

## 2021-11-21 ENCOUNTER — Emergency Department
Admission: EM | Admit: 2021-11-21 | Discharge: 2021-11-22 | Disposition: A | Discharge status: Home / Self Care | Payer: Medicare Other | Attending: Emergency Medicine | Admitting: Emergency Medicine

## 2021-11-21 ENCOUNTER — Other Ambulatory Visit: Payer: Self-pay

## 2021-11-21 DIAGNOSIS — K8020 Calculus of gallbladder without cholecystitis without obstruction: Secondary | ICD-10-CM | POA: Insufficient documentation

## 2021-11-21 DIAGNOSIS — I129 Hypertensive chronic kidney disease with stage 1 through stage 4 chronic kidney disease, or unspecified chronic kidney disease: Secondary | ICD-10-CM | POA: Insufficient documentation

## 2021-11-21 DIAGNOSIS — N189 Chronic kidney disease, unspecified: Secondary | ICD-10-CM | POA: Diagnosis not present

## 2021-11-21 DIAGNOSIS — Z79899 Other long term (current) drug therapy: Secondary | ICD-10-CM | POA: Insufficient documentation

## 2021-11-21 DIAGNOSIS — Z7982 Long term (current) use of aspirin: Secondary | ICD-10-CM | POA: Insufficient documentation

## 2021-11-21 DIAGNOSIS — R109 Unspecified abdominal pain: Secondary | ICD-10-CM | POA: Diagnosis present

## 2021-11-21 LAB — COMPREHENSIVE METABOLIC PANEL
ALT: 17 U/L (ref 0–44)
AST: 27 U/L (ref 15–41)
Albumin: 3.8 g/dL (ref 3.5–5.0)
Alkaline Phosphatase: 119 U/L (ref 38–126)
Anion gap: 10 (ref 5–15)
BUN: 14 mg/dL (ref 8–23)
CO2: 22 mmol/L (ref 22–32)
Calcium: 9.4 mg/dL (ref 8.9–10.3)
Chloride: 107 mmol/L (ref 98–111)
Creatinine, Ser: 1.54 mg/dL — ABNORMAL HIGH (ref 0.61–1.24)
GFR, Estimated: 43 mL/min — ABNORMAL LOW (ref >60)
Glucose, Bld: 138 mg/dL — ABNORMAL HIGH (ref 70–99)
Potassium: 3.8 mmol/L (ref 3.5–5.1)
Sodium: 139 mmol/L (ref 135–145)
Total Bilirubin: 0.8 mg/dL (ref 0.3–1.2)
Total Protein: 8.3 g/dL — ABNORMAL HIGH (ref 6.5–8.1)

## 2021-11-21 LAB — LIPASE, BLOOD: Lipase: 73 U/L — ABNORMAL HIGH (ref 11–51)

## 2021-11-21 LAB — CBC
HCT: 47 % (ref 39.0–52.0)
Hemoglobin: 14.9 g/dL (ref 13.0–17.0)
MCH: 26.4 pg (ref 26.0–34.0)
MCHC: 31.7 g/dL (ref 30.0–36.0)
MCV: 83.3 fL (ref 80.0–100.0)
Platelets: 221 10*3/uL (ref 150–400)
RBC: 5.64 MIL/uL (ref 4.22–5.81)
RDW: 13.3 % (ref 11.5–15.5)
WBC: 12.5 10*3/uL — ABNORMAL HIGH (ref 4.0–10.5)
nRBC: 0 % (ref 0.0–0.2)

## 2021-11-21 MED ORDER — ACETAMINOPHEN 500 MG PO TABS
1000.0000 mg | ORAL_TABLET | Freq: Once | ORAL | Status: AC
  Administered 2021-11-22: 1000 mg via ORAL
  Filled 2021-11-21: qty 2

## 2021-11-21 NOTE — ED Triage Notes (Signed)
Pt brother, Kala Gassmann, [phone], please call when pt is in room so he can come.

## 2021-11-21 NOTE — ED Triage Notes (Signed)
Pt in via EMS from [facility] on [address] with c/o abd pain to RUQ. Pt denies tenderness with palpation. 150/76, 95-99% RA, HR 70

## 2021-11-21 NOTE — ED Triage Notes (Addendum)
See first nurse note- Pt to ER with c/o RUQ pain that started around 12pm this afternoon, reports eating a sandwich prior to the pain starting. Pain worse with inspiration. Denies NVD. Denies hx of abdominal surgeries.

## 2021-11-21 NOTE — ED Provider Notes (Signed)
[facility] Provider Note    Event Date/Time   First MD Initiated Contact with Patient 11/21/21 2326     (approximate)   History   Abdominal Pain   HPI  Frank Jefferson is a 88 y.o. male with history of hypertension, chronic kidney disease, alcohol abuse who lives in assisted living facility who presents to the emergency department upper abdominal pain that started today with nausea.  Symptoms have now resolved.  No chest pain, shortness of breath, fevers, vomiting, diarrhea, dysuria, hematuria, bloody stools or melena.  No previous abdominal surgeries.   History provided by patient, brother, sister-in-law.      Past Medical History:  Diagnosis Date   Alcohol abuse    Chronic kidney disease    Hypertension     Past Surgical History:  Procedure Laterality Date   CATARACT EXTRACTION W/PHACO Right 01/17/2018   Procedure: CATARACT EXTRACTION PHACO AND INTRAOCULAR LENS PLACEMENT ([place]) COMPLICATED RIGHT;  Surgeon: Leandrew Koyanagi, MD;  Location: [place];  Service: Ophthalmology;  Laterality: Right;   CATARACT EXTRACTION W/PHACO Left 03/14/2018   Procedure: CATARACT EXTRACTION PHACO AND INTRAOCULAR LENS PLACEMENT ([place]) COMPLICATED LEFT;  Surgeon: Leandrew Koyanagi, MD;  Location: [place];  Service: Ophthalmology;  Laterality: Left;   CORONARY/GRAFT ACUTE MI REVASCULARIZATION N/A 04/03/2019   Procedure: Coronary/Graft Acute MI Revascularization;  Surgeon: Yolonda Kida, MD;  Location: [place] [facility];  Service: Cardiovascular;  Laterality: N/A;   FOOT SURGERY Right    metal plate   LEFT HEART CATH AND CORONARY ANGIOGRAPHY N/A 04/03/2019   Procedure: LEFT HEART CATH AND CORONARY ANGIOGRAPHY;  Surgeon: Yolonda Kida, MD;  Location: [place] [facility];  Service: Cardiovascular;  Laterality: N/A;    MEDICATIONS:  Prior to Admission medications   Medication Sig Start Date End Date Taking? Authorizing Provider   acetaminophen (TYLENOL) 325 MG tablet Take 650 mg by mouth every 4 (four) hours as needed for fever.    [provider]  alum & mag hydroxide-simeth (MINTOX) 200-200-20 MG/5ML suspension Take 30 mLs by mouth daily as needed for indigestion or heartburn.    [provider]  amLODipine (NORVASC) 5 MG tablet Take 1 tablet (5 mg total) by mouth daily. 04/06/19   Fritzi Mandes, MD  aspirin 81 MG chewable tablet Chew 1 tablet (81 mg total) by mouth daily. 04/06/19   Fritzi Mandes, MD  atorvastatin (LIPITOR) 80 MG tablet Take 1 tablet (80 mg total) by mouth daily at 6 PM. 04/05/19   Fritzi Mandes, MD  bismuth subsalicylate (PEPTO BISMOL) 262 MG/15ML suspension Take 15 mLs by mouth every 2 (two) hours as needed for indigestion (max 6 doses in 24 hours).    [provider]  dicyclomine (BENTYL) 10 MG capsule Take 10 mg by mouth 4 (four) times daily.    [provider]  folic acid (FOLVITE) 1 MG tablet Take 1 mg by mouth daily.    [provider]  loperamide (IMODIUM) 2 MG capsule Take 2 mg by mouth as needed for diarrhea or loose stools (max 4 doses).    [provider]  LORazepam (ATIVAN) 0.5 MG tablet Take 0.5 mg by mouth 2 (two) times daily as needed for anxiety.    [provider]  magnesium hydroxide (MILK OF MAGNESIA) 400 MG/5ML suspension Take 30 mLs by mouth daily as needed for mild constipation.    [provider]  magnesium oxide (MAG-OX) 400 MG tablet Take 400 mg by mouth 3 (  three) times daily.    [provider]  Menthol (HONEY LEMON COUGH DROPS MT) Use as directed 1 tablet in the mouth or throat every 3 (three) hours as needed (cough).    [provider]  metoprolol tartrate (LOPRESSOR) 25 MG tablet Take 1 tablet (25 mg total) by mouth 2 (two) times daily. 04/05/19   Fritzi Mandes, MD  Pseudoephedrine-DM-GG (ROBITUSSIN CF PO) Take 10 mLs by mouth every 6 (six) hours as needed (cough/congestion).    [provider]  psyllium (METAMUCIL) 58.6 % powder Take 1 packet by mouth 2 (two) times a day.    [provider]  Skin Protectants, Misc. (BAZA PROTECT EX) Apply 1 application topically 2 (two) times daily as needed (skin protection).    [provider]  sodium phosphate (FLEET) 7-19 GM/118ML ENEM Place 1 enema rectally daily as needed for severe constipation (not relieved by milk of mag and prune juice).    [provider]  thiamine (VITAMIN B-1) 100 MG tablet Take 100 mg by mouth daily.    [provider]  ticagrelor (BRILINTA) 90 MG TABS tablet Take 1 tablet (90 mg total) by mouth 2 (two) times daily. 04/05/19   Fritzi Mandes, MD  traMADol (ULTRAM) 50 MG tablet Take 1 tablet (50 mg total) by mouth every 8 (eight) hours as needed for moderate pain (right foot). 04/02/20   Johnn Hai, PA-C    Physical Exam   Triage Vital Signs: ED Triage Vitals [11/21/21 1453]  Enc Vitals Group     BP (!) 153/95     Pulse Rate 79     Resp 18     Temp (!) 97.5 F (36.4 C)     Temp Source Oral     SpO2 99 %     Weight      Height 6\' 2"  (1.88 m)     Head Circumference      Peak Flow      Pain Score 3     Pain Loc      Pain Edu?      Excl. in [place]?     Most recent vital signs: Vitals:   11/21/21 1831 11/22/21 0113  BP: (!) 153/92 (!) 144/90  Pulse: 71 80  Resp: 18 16  Temp: 97.6 F (36.4 C)   SpO2: 100% 97%    CONSTITUTIONAL: Alert and oriented and responds appropriately to questions. Well-appearing; well-nourished, elderly HEAD: Normocephalic, atraumatic EYES: Conjunctivae clear, pupils appear equal, sclera nonicteric ENT: normal nose; moist mucous membranes NECK: Supple, normal ROM CARD: RRR; S1 and S2 appreciated; no murmurs, no clicks, no rubs, no gallops RESP: Normal chest excursion without splinting or tachypnea; breath sounds clear and equal bilaterally; no wheezes, no rhonchi, no rales, no hypoxia or respiratory distress, speaking full sentences ABD/GI:  Normal bowel sounds; non-distended; soft, non-tender, no rebound, no guarding, no peritoneal signs BACK: The back appears normal EXT: Normal ROM in all joints; no deformity noted, no edema; no cyanosis SKIN: Normal color for age and race; warm; no rash on exposed skin NEURO: Moves all extremities equally, normal speech PSYCH: The patient's mood and manner are appropriate.   ED Results / Procedures / Treatments   LABS: (all labs ordered are listed, but only abnormal results are displayed) Labs Reviewed  LIPASE, BLOOD - Abnormal; Notable for the following components:      Result Value   Lipase 73 (*)    All other components within normal limits  COMPREHENSIVE METABOLIC  PANEL - Abnormal; Notable for the following components:   Glucose, Bld 138 (*)    Creatinine, Ser 1.54 (*)    Total Protein 8.3 (*)    GFR, Estimated 43 (*)    All other components within normal limits  CBC - Abnormal; Notable for the following components:   WBC 12.5 (*)    All other components within normal limits     EKG:  EKG Interpretation  Date/Time:  Sunday November 21 2021 15:03:37 EST Ventricular Rate:  71 PR Interval:  218 QRS Duration: 124 QT Interval:  444 QTC Calculation: 482 R Axis:   -87 Text Interpretation: Sinus rhythm with 1st degree A-V block Right bundle branch block Left anterior fascicular block Bifascicular block Possible Lateral infarct (cited on or before 04-Apr-2019) Abnormal ECG When compared with ECG of 04-Apr-2019 04:18, PR interval has increased Right bundle branch block has replaced Incomplete right bundle branch block Questionable change in initial forces of Lateral leads Confirmed by Pryor Curia [phone]) on 11/21/2021 11:39:07 PM         RADIOLOGY: My personal review and interpretation of imaging: Ultrasound shows cholelithiasis.  I have personally reviewed all radiology reports.   US ABDOMEN LIMITED RUQ (LIVER/GB)  Result Date: 11/21/2021 CLINICAL DATA:  Abdominal pain  EXAM: ULTRASOUND ABDOMEN LIMITED RIGHT UPPER QUADRANT COMPARISON:  None. FINDINGS: Gallbladder: Well distended with multiple gallstones. Negative sonographic Murphy's sign is elicited. Common bile duct: Diameter: 5.1 mm. Liver: No focal lesion identified. Within normal limits in parenchymal echogenicity. Portal vein is patent on color Doppler imaging with normal direction of blood flow towards the liver. Other: None. IMPRESSION: Cholelithiasis without complicating factors. Electronically Signed   By: Inez Catalina M.D.   On: 11/21/2021 21:13     PROCEDURES:  Critical Care performed: No     Procedures    IMPRESSION / MDM / ASSESSMENT AND PLAN / ED COURSE  I reviewed the triage vital signs and the nursing notes.    Patient here with right upper quad abdominal pain and nausea that has resolved.     DIFFERENTIAL DIAGNOSIS (includes but not limited to):   Cholelithiasis, cholecystitis, pancreatitis, gastritis, GERD.  Doubt appendicitis, bowel obstruction, colitis, volvulus, UTI.  No chest pain or shortness of breath.  Doubt ACS, PE or dissection.   PLAN: We will obtain labs, right upper quadrant ultrasound, EKG.  Will give Tylenol for pain control here as he states he is having small amount of discomfort.   MEDICATIONS GIVEN IN ED: Medications  acetaminophen (TYLENOL) tablet 1,000 mg (1,000 mg Oral Given 11/22/21 0000)     ED COURSE: Patient's labs today are unremarkable other than very minimal elevation of his white blood cell count and lipase.  He has had of elevated lipase previously in 2016.  Ultrasound reviewed by myself and radiology shows cholelithiasis without cholecystitis, choledocholithiasis, cholangitis.  EKG is nonischemic.  His abdominal exam is benign.  I do not feel he needs further imaging or emergent work-up.  I feel he is safe for discharge.  Discussed this with patient, brother and sister-in-law.  They are all comfortable with this plan.  Will discharge back to his  assisted living facility.  Will discharge with prescription of tramadol given he has taken this in the past and done well.  Will discharge with Zofran as well.  Given outpatient general surgery follow-up information.  Discussed importance of a low-fat diet.   At this time, I do not feel there is any life-threatening condition present. I reviewed  all nursing notes, vitals, pertinent previous records.  All labs, EKGs, imaging ordered have been independently reviewed and interpreted by myself.  I have reviewed nursing notes and appropriate previous records.  I feel the patient is safe to be discharged home without further emergent workup and can continue workup as an outpatient as needed. Discussed all findings and treatment plan with patient and family as well as usual and customary return precautions.  They verbalize understanding and are comfortable with this plan.  Outpatient follow-up has been provided as needed. All questions have been answered.    CONSULTS: No admission needed at this time given benign abdominal exam and no signs of cholecystitis.   OUTSIDE RECORDS REVIEWED: Reviewed patient's previous cardiology note on 04/10/2019.  Patient does not have a history of STEMI with total occlusion of the circumflex requiring a drug-eluting stent.  No symptoms of chest pain today.         FINAL CLINICAL IMPRESSION(S) / ED DIAGNOSES   Final diagnoses:  Abdominal pain  Gallstones     Rx / DC Orders   ED Discharge Orders          Ordered    traMADol (ULTRAM) 50 MG tablet  Every 8 hours PRN        11/22/21 0018    ondansetron (ZOFRAN-ODT) 4 MG disintegrating tablet  Every 6 hours PRN        11/22/21 0018             Note:  This document was prepared using Dragon voice recognition software and may include unintentional dictation errors.   Kadeem Hyle, Delice Bison, DO 11/22/21 [phone]

## 2021-11-22 MED ORDER — TRAMADOL HCL 50 MG PO TABS
50.0000 mg | ORAL_TABLET | Freq: Three times a day (TID) | ORAL | 0 refills | Status: AC | PRN
Start: 2021-11-22 — End: 2022-11-22

## 2021-11-22 MED ORDER — ONDANSETRON 4 MG PO TBDP: 4.0000 mg | ORAL_TABLET | Freq: Four times a day (QID) | ORAL | 0 refills | Status: AC | PRN

## 2021-11-22 NOTE — ED Notes (Signed)
[facility] to transport back to [facility]

## 2021-12-20 DIAGNOSIS — E119 Type 2 diabetes mellitus without complications: Secondary | ICD-10-CM | POA: Insufficient documentation

## 2021-12-22 ENCOUNTER — Ambulatory Visit (INDEPENDENT_AMBULATORY_CARE_PROVIDER_SITE_OTHER): Payer: Medicare Other | Admitting: Surgery

## 2021-12-22 ENCOUNTER — Other Ambulatory Visit: Payer: Self-pay

## 2021-12-22 ENCOUNTER — Encounter: Payer: Self-pay | Admitting: Surgery

## 2021-12-22 VITALS — BP 152/76 | HR 61 | Temp 98.0°F | Ht 61.0 in | Wt 146.4 lb

## 2021-12-22 DIAGNOSIS — K589 Irritable bowel syndrome without diarrhea: Secondary | ICD-10-CM | POA: Insufficient documentation

## 2021-12-22 DIAGNOSIS — E039 Hypothyroidism, unspecified: Secondary | ICD-10-CM | POA: Insufficient documentation

## 2021-12-22 DIAGNOSIS — K8020 Calculus of gallbladder without cholecystitis without obstruction: Secondary | ICD-10-CM

## 2021-12-22 DIAGNOSIS — I6523 Occlusion and stenosis of bilateral carotid arteries: Secondary | ICD-10-CM | POA: Insufficient documentation

## 2021-12-22 DIAGNOSIS — H409 Unspecified glaucoma: Secondary | ICD-10-CM | POA: Insufficient documentation

## 2021-12-22 DIAGNOSIS — F0390 Unspecified dementia without behavioral disturbance: Secondary | ICD-10-CM | POA: Insufficient documentation

## 2021-12-22 DIAGNOSIS — Z79899 Other long term (current) drug therapy: Secondary | ICD-10-CM | POA: Insufficient documentation

## 2021-12-22 DIAGNOSIS — E042 Nontoxic multinodular goiter: Secondary | ICD-10-CM | POA: Insufficient documentation

## 2021-12-22 DIAGNOSIS — E559 Vitamin D deficiency, unspecified: Secondary | ICD-10-CM | POA: Insufficient documentation

## 2021-12-22 DIAGNOSIS — I714 Abdominal aortic aneurysm, without rupture, unspecified: Secondary | ICD-10-CM | POA: Insufficient documentation

## 2021-12-22 DIAGNOSIS — I70223 Atherosclerosis of native arteries of extremities with rest pain, bilateral legs: Secondary | ICD-10-CM | POA: Insufficient documentation

## 2021-12-22 DIAGNOSIS — E119 Type 2 diabetes mellitus without complications: Secondary | ICD-10-CM | POA: Insufficient documentation

## 2021-12-22 DIAGNOSIS — F4323 Adjustment disorder with mixed anxiety and depressed mood: Secondary | ICD-10-CM | POA: Insufficient documentation

## 2021-12-22 DIAGNOSIS — F1010 Alcohol abuse, uncomplicated: Secondary | ICD-10-CM | POA: Insufficient documentation

## 2021-12-22 DIAGNOSIS — H04129 Dry eye syndrome of unspecified lacrimal gland: Secondary | ICD-10-CM | POA: Insufficient documentation

## 2021-12-22 DIAGNOSIS — M1990 Unspecified osteoarthritis, unspecified site: Secondary | ICD-10-CM | POA: Insufficient documentation

## 2021-12-22 DIAGNOSIS — E785 Hyperlipidemia, unspecified: Secondary | ICD-10-CM | POA: Insufficient documentation

## 2021-12-22 NOTE — Progress Notes (Signed)
12/22/2021  Reason for Visit: Cholelithiasis  History of Present Illness: Frank Jefferson is a 88 y.o. male presenting for evaluation of cholelithiasis.  Patient was seen in the emergency room on 11/21/2021 for an episode of epigastric abdominal pain.  He denied any nausea or vomiting at the time.  By time he was in the emergency room, his symptoms had resolved.  His work-up showed a white blood cell count of 12.5, lipase of 73, and an ultrasound was done showing cholelithiasis but no evidence of cholecystitis.  He was discharged home in good condition.  He presents today for further evaluation.  He reports that since the episode in the emergency room, he has not had any further issues.  Denies any further abdominal pain.  Denies any nausea, vomiting, fevers, chills, chest pain, shortness of breath.  The patient is accompanied today by the coordinator of the caregiving facilities at his assisted living facility.  She reports that recently he has not seem to be the same and reports that he has been having less appetite and a little bit slower to move around.  However to her recollection, he has not had any complaints of abdominal pain.  The patient also does have a history of some dementia.  His brother Sieler is the power of attorney.  Of note, the patient does have a history of STEMI in May 2020 and underwent cardiac catheterization with stent placement.  He is currently on aspirin and Brilinta.  However, according to the chart, he has not seen Dr. Clayborn Bigness since June 2020.  Past Medical History: Past Medical History:  Diagnosis Date   Alcohol abuse    Chronic kidney disease    Hypertension      Past Surgical History: Past Surgical History:  Procedure Laterality Date   CATARACT EXTRACTION W/PHACO Right 01/17/2018   Procedure: CATARACT EXTRACTION PHACO AND INTRAOCULAR LENS PLACEMENT ([place]) COMPLICATED RIGHT;  Surgeon: Leandrew Koyanagi, MD;  Location: [facility];  Service:  Ophthalmology;  Laterality: Right;   CATARACT EXTRACTION W/PHACO Left 03/14/2018   Procedure: CATARACT EXTRACTION PHACO AND INTRAOCULAR LENS PLACEMENT ([place]) COMPLICATED LEFT;  Surgeon: Leandrew Koyanagi, MD;  Location: [facility];  Service: Ophthalmology;  Laterality: Left;   CORONARY/GRAFT ACUTE MI REVASCULARIZATION N/A 04/03/2019   Procedure: Coronary/Graft Acute MI Revascularization;  Surgeon: Yolonda Kida, MD;  Location: [facility];  Service: Cardiovascular;  Laterality: N/A;   FOOT SURGERY Right    metal plate   LEFT HEART CATH AND CORONARY ANGIOGRAPHY N/A 04/03/2019   Procedure: LEFT HEART CATH AND CORONARY ANGIOGRAPHY;  Surgeon: Yolonda Kida, MD;  Location: [facility];  Service: Cardiovascular;  Laterality: N/A;    Home Medications: Prior to Admission medications   Medication Sig Start Date End Date Taking? Authorizing Provider  acetaminophen (TYLENOL) 325 MG tablet Take 650 mg by mouth every 4 (four) hours as needed for fever.   Yes [provider]  allopurinol (ZYLOPRIM) 100 MG tablet Take 100 mg by mouth daily.   Yes [provider]  alum & mag hydroxide-simeth (MAALOX/MYLANTA) 200-200-20 MG/5ML suspension Take 30 mLs by mouth daily as needed for indigestion or heartburn.   Yes [provider]  amLODipine (NORVASC) 5 MG tablet Take 1 tablet (5 mg total) by mouth daily. 04/06/19  Yes Fritzi Mandes, MD  aspirin 81 MG chewable tablet Chew 1 tablet (81 mg total) by mouth daily. 04/06/19  Yes Fritzi Mandes, MD  atorvastatin (LIPITOR) 80 MG tablet Take 1 tablet (  80 mg total) by mouth daily at 6 PM. 04/05/19  Yes Fritzi Mandes, MD  bismuth subsalicylate (PEPTO BISMOL) 262 MG/15ML suspension Take 15 mLs by mouth every 2 (two) hours as needed for indigestion (max 6 doses in 24 hours).   Yes [provider]  dicyclomine (BENTYL) 10 MG capsule Take 10 mg by mouth 4 (four) times daily.   Yes [provider]  folic acid  (FOLVITE) 1 MG tablet Take 1 mg by mouth daily.   Yes [provider]  loperamide (IMODIUM) 2 MG capsule Take 2 mg by mouth as needed for diarrhea or loose stools (max 4 doses).   Yes [provider]  LORazepam (ATIVAN) 0.5 MG tablet Take 0.5 mg by mouth 2 (two) times daily as needed for anxiety.   Yes [provider]  magnesium hydroxide (MILK OF MAGNESIA) 400 MG/5ML suspension Take 30 mLs by mouth daily as needed for mild constipation.   Yes [provider]  magnesium oxide (MAG-OX) 400 MG tablet Take 400 mg by mouth 3 (three) times daily.   Yes [provider]  Menthol (HONEY LEMON COUGH DROPS MT) Use as directed 1 tablet in the mouth or throat every 3 (three) hours as needed (cough).   Yes [provider]  metoprolol tartrate (LOPRESSOR) 25 MG tablet Take 1 tablet (25 mg total) by mouth 2 (two) times daily. 04/05/19  Yes Fritzi Mandes, MD  ondansetron (ZOFRAN-ODT) 4 MG disintegrating tablet Take 1 tablet (4 mg total) by mouth every 6 (six) hours as needed for nausea or vomiting. 11/22/21  Yes Ward, Kristen N, DO  Pseudoephedrine-DM-GG (ROBITUSSIN CF PO) Take 10 mLs by mouth every 6 (six) hours as needed (cough/congestion).   Yes [provider]  psyllium (METAMUCIL) 58.6 % powder Take 1 packet by mouth 2 (two) times a day.   Yes [provider]  Skin Protectants, Misc. (BAZA PROTECT EX) Apply 1 application topically 2 (two) times daily as needed (skin protection).   Yes [provider]  sodium phosphate (FLEET) 7-19 GM/118ML ENEM Place 1 enema rectally daily as needed for severe constipation (not relieved by milk of mag and prune juice).   Yes [provider]  thiamine (VITAMIN B-1) 100 MG tablet Take 100 mg by mouth daily.   Yes [provider]  ticagrelor (BRILINTA) 90 MG TABS tablet Take 1 tablet (90 mg total) by mouth 2 (two) times daily. 04/05/19  Yes Fritzi Mandes, MD  traMADol (ULTRAM) 50 MG tablet  Take 1 tablet (50 mg total) by mouth every 8 (eight) hours as needed. 11/22/21 11/22/22 Yes Ward, Delice Bison, DO    Allergies: Allergies  Allergen Reactions   Ace Inhibitors Anaphylaxis and Swelling   Ciprofloxacin Anaphylaxis   Penicillins Anaphylaxis    Social History:  reports that he has never smoked. His smokeless tobacco use includes snuff. He reports current alcohol use. He reports that he does not use drugs.   Family History: Family History  Problem Relation Age of Onset   Hypertension Mother    Cancer Mother    CAD Mother    Cancer Brother     Review of Systems: Review of Systems  Constitutional:  Negative for chills and fever.  HENT:  Negative for hearing loss.   Respiratory:  Negative for shortness of breath.   Cardiovascular:  Negative for chest pain.  Gastrointestinal:  Negative for abdominal pain, nausea and vomiting.  Genitourinary:  Negative for dysuria.  Musculoskeletal:  Negative for myalgias.  Skin:  Negative for rash.  Neurological:  Negative for dizziness.  Psychiatric/Behavioral:  Negative for depression.    Physical Exam BP (!) 152/76    Pulse 61    Temp 98 F (36.7 C) (Oral)    Ht 5\' 1"  (1.549 m)    Wt 146 lb 6.4 oz (66.4 kg)    SpO2 94%    BMI 27.66 kg/m  CONSTITUTIONAL: No acute distress, well-nourished HEENT:  Normocephalic, atraumatic, extraocular motion intact. NECK: Trachea is midline, and there is no jugular venous distension.  RESPIRATORY:  Lungs are clear, and breath sounds are equal bilaterally. Normal respiratory effort without pathologic use of accessory muscles. CARDIOVASCULAR: Heart is regular without murmurs, gallops, or rubs. GI: The abdomen is soft, nondistended, nontender to palpation.  MUSCULOSKELETAL:  Normal muscle strength and tone in all four extremities.  No peripheral edema or cyanosis. SKIN: Skin turgor is normal. There are no pathologic skin lesions.  NEUROLOGIC:  Motor and sensation is grossly normal.  Cranial nerves are  grossly intact. PSYCH:  Alert and oriented to person, place and time. Affect is normal.  Laboratory Analysis: Labs from 11/21/2021: Sodium 139, potassium 3.8, chloride 107, CO2 22, BUN 14, creatinine 1.54.  Total bilirubin 0.8, AST 27, ALT 17, alkaline phosphatase 119, albumin 3.8, lipase 73.  WBC 12.5, hemoglobin 14.9, hematocrit 47, platelets 221.  Imaging: Ultrasound RUQ on 11/21/2021: IMPRESSION: Cholelithiasis without complicating factors.  Assessment and Plan: This is a 88 y.o. male with cholelithiasis.  - The patient reports that this was his first episode of abdominal pain and has not had any further episodes since then.  Discussed with him the potential options for watchful waiting and would recommend if that is the case a low-fat diet to try to trigger the gallbladder issues less.  With that he has cholelithiasis, he is at risk of further biliary colic episodes but cannot predict when or if they will occur.  He is also at risk of developing acute cholecystitis.  Another option would be proceeding with surgical management in the form of robotic cholecystectomy.  At this point, the patient is unsure how he will want to proceed and he wants to discuss this further with his brother theater who is his power of attorney.  I think that is perfectly reasonable and discussed with the patient and his caregiver that if his brother would want to have a new appointment with me and his brother present so we can discuss things further and come to a decision we can definitely set that up or if he would prefer a phone call from me instead we can do that also.  Discussed with him that if he wishes to proceed with surgery, we would definitely need medical and cardiology clearance.  Particular because he has not seen his cardiologist in more than 2 years we will need clearance for this to stop his aspirin and Brilinta. - Patient's caregiver will contact us with further plans.  I spent 45 minutes dedicated to  the care of this patient on the date of this encounter to include pre-visit review of records, face-to-face time with the patient discussing diagnosis and management, and any post-visit coordination of care.   Melvyn Neth, [facility]

## 2021-12-22 NOTE — Patient Instructions (Addendum)
If you would like to proceed with surgery please our office to make a follow up appointment.    Our surgery scheduler will call you within 24-48 hours to schedule your surgery. Please have the [place] surgery sheet available when speaking with her.  Please call Dr.Callwood's office [phone]) to see if you need to make an appointment with him. You are taking an Anticoagulant and he will need to complete a Cardiac Clearance form.   Please call his PCP Irene Limbo and let her know we have sent a Medical Clearance form and ask if Mr.Cannizzaro needs to make an appointment.    Minimally Invasive Cholecystectomy Minimally invasive cholecystectomy is surgery to remove the gallbladder. The gallbladder is a pear-shaped organ that lies beneath the liver on the right side of the body. The gallbladder stores bile, which is a fluid that helps the body digest fats. Cholecystectomy is often done to treat inflammation (irritation and swelling) of the gallbladder (cholecystitis). This condition is usually caused by a buildup of gallstones (cholelithiasis) in the gallbladder or when the fluid in the gall bladder becomes stagnant because gallstones get stuck in the ducts (tubes) and block the flow of bile. This can result in inflammation and pain. In severe cases, emergency surgery may be required. This procedure is done through small incisions in the abdomen, instead of one large incision. It is also called laparoscopic surgery. A thin scope with a camera (laparoscope) is inserted through one incision. Then surgical instruments are inserted through the other incisions. In some cases, a minimally invasive surgery may need to be changed to a surgery that is done through a larger incision. This is called open surgery. Tell a health care provider about: Any allergies you have. All medicines you are taking, including vitamins, herbs, eye drops, creams, and over-the-counter medicines. Any problems you or family members  have had with anesthetic medicines. Any bleeding problems you have. Any surgeries you have had. Any medical conditions you have. Whether you are pregnant or may be pregnant. What are the risks? Generally, this is a safe procedure. However, problems may occur, including: Infection. Bleeding. Allergic reactions to medicines. Damage to nearby structures or organs. A gallstone remaining in the common bile duct. The common bile duct carries bile from the gallbladder to the small intestine. A bile leak from the liver or cystic duct after your gallbladder is removed. What happens before the procedure? When to stop eating and drinking Follow instructions from your health care provider about what you may eat and drink before your procedure. These may include: 8 hours before the procedure Stop eating most foods. Do not eat meat, fried foods, or fatty foods. Eat only light foods, such as toast or crackers. All liquids are okay except energy drinks and alcohol. 6 hours before the procedure Stop eating. Drink only clear liquids, such as water, clear fruit juice, black coffee, plain tea, and sports drinks. Do not drink energy drinks or alcohol. 2 hours before the procedure Stop drinking all liquids. You may be allowed to take medicines with small sips of water. If you do not follow your health care provider's instructions, your procedure may be delayed or canceled. Medicines Ask your health care provider about: Changing or stopping your regular medicines. This is especially important if you are taking diabetes medicines or blood thinners. Taking medicines such as aspirin and ibuprofen. These medicines can thin your blood. Do not take these medicines unless your health care provider tells you to take them. Taking  over-the-counter medicines, vitamins, herbs, and supplements. General instructions If you will be going home right after the procedure, plan to have a responsible adult: Take you home  from the hospital or clinic. You will not be allowed to drive. Care for you for the time you are told. Do not use any products that contain nicotine or tobacco for at least 4 weeks before the procedure. These products include cigarettes, chewing tobacco, and vaping devices, such as e-cigarettes. If you need help quitting, ask your health care provider. Ask your health care provider: How your surgery site will be marked. What steps will be taken to help prevent infection. These may include: Removing hair at the surgery site. Washing skin with a germ-killing soap. Taking antibiotic medicine. What happens during the procedure?  An IV will be inserted into one of your veins. You will be given one or both of the following: A medicine to help you relax (sedative). A medicine to make you fall asleep (general anesthetic). Your surgeon will make several small incisions in your abdomen. The laparoscope will be inserted through one of the small incisions. The camera on the laparoscope will send images to a monitor in the operating room. This lets your surgeon see inside your abdomen. A gas will be pumped into your abdomen. This will expand your abdomen to give the surgeon more room to perform the surgery. Other tools that are needed for the procedure will be inserted through the other incisions. The gallbladder will be removed through one of the incisions. Your common bile duct may be examined. If stones are found in the common bile duct, they may be removed. After your gallbladder has been removed, the incisions will be closed with stitches (sutures), staples, or skin glue. Your incisions will be covered with a bandage (dressing). The procedure may vary among health care providers and hospitals. What happens after the procedure? Your blood pressure, heart rate, breathing rate, and blood oxygen level will be monitored until you leave the hospital or clinic. You will be given medicines as needed to  control your pain. You may have a drain placed in the incision. The drain will be removed a day or two after the procedure. Summary Minimally invasive cholecystectomy, also called laparoscopic cholecystectomy, is surgery to remove the gallbladder using small incisions. Tell your health care provider about all the medical conditions you have and all the medicines you are taking for those conditions. Before the procedure, follow instructions about when to stop eating and drinking and changing or stopping medicines. Plan to have a responsible adult care for you for the time you are told after you leave the hospital or clinic. This information is not intended to replace advice given to you by your health care provider. Make sure you discuss any questions you have with your health care provider. Document Revised: 04/27/2021 Document Reviewed: 04/27/2021 Elsevier Patient Education  2022 [employer].     Cholelithiasis Cholelithiasis is a disease in which gallstones form in the gallbladder. The gallbladder is an organ that stores bile. Bile is a fluid that helps to digest fats. Gallstones begin as small crystals and can slowly grow into stones. They may cause no symptoms until they block the gallbladder duct, or cystic duct, when the gallbladder tightens (contracts) after food is eaten. This can cause pain and is known as a gallbladder attack, or biliary colic. There are two main types of gallstones: Cholesterol stones. These are the most common type of gallstone. These stones are made  of hardened cholesterol and are usually yellow-green in color. Cholesterol is a fat-like substance that is made in the liver. Pigment stones. These are dark in color and are made of a red-yellow substance, called bilirubin,that forms when hemoglobin from red blood cells breaks down. What are the causes? This condition may be caused by an imbalance in the different parts that make bile. This can happen if the bile: Has  too much bilirubin. This can happen in certain blood diseases, such as sickle cell anemia. Has too much cholesterol. Does not have enough bile salts. These salts help the body absorb and digest fats. In some cases, this condition can also be caused by the gallbladder not emptying completely or often enough. This is common during pregnancy. What increases the risk? The following factors may make you more likely to develop this condition: Being male. Having multiple pregnancies. Health care providers sometimes advise removing diseased gallbladders before future pregnancies. Eating a diet that is heavy in fried foods, fat, and refined carbohydrates, such as white bread and white rice. Being obese. Being older than age 30. Using medicines that contain male hormones (estrogen) for a long time. Losing weight quickly. Having a family history of gallstones. Having certain medical problems, such as: Diabetes mellitus. Cystic fibrosis. Crohn's disease. Cirrhosis or other long-term (chronic) liver disease. Certain blood diseases, such as sickle cell anemia or leukemia. What are the signs or symptoms? In many cases, having gallstones causes no symptoms. When you have gallstones but do not have symptoms, you have silent gallstones. If a gallstone blocks your bile duct, it can cause a gallbladder attack. The main symptom of a gallbladder attack is sudden pain in the upper right part of the abdomen. The pain: Usually comes at night or after eating. Can last for one hour or more. Can spread to your right shoulder, back, or chest. Can feel like indigestion. This is discomfort, burning, or fullness in your upper abdomen. If the bile duct is blocked for more than a few hours, it can cause an infection or inflammation of your gallbladder (cholecystitis), liver, or pancreas. This can cause: Nausea or vomiting. Bloating. Pain in your abdomen that lasts for 5 hours or longer. Tenderness in your upper  abdomen, often in the upper right section and under your rib cage. Fever or chills. Skin or the white parts of your eyes turning yellow (jaundice). This usually happens when a stone has blocked bile from passing through the common bile duct. Dark urine or light-colored stools. How is this diagnosed? This condition may be diagnosed based on: A physical exam. Your medical history. Ultrasound. CT scan. MRI. You may also have other tests, including: Blood tests to check for signs of an infection or inflammation. Cholescintigraphy, or HIDA scan. This is a scan of your gallbladder and bile ducts (biliary system) using non-harmful radioactive material and special cameras that can see the radioactive material. Endoscopic retrograde cholangiopancreatogram. This involves inserting a small tube with a camera on the end (endoscope) through your mouth to look at bile ducts and check for blockages. How is this treated? Treatment for this condition depends on the severity of the condition. Silent gallstones do not need treatment. Treatment may be needed if a blockage causes a gallbladder attack or other symptoms. Treatment may include: Home care, if symptoms are not severe. During a simple gallbladder attack, stop eating and drinking for 12-24 hours (except for water and clear liquids). This helps to "cool down" your gallbladder. After 1 or 2  days, you can start to eat a diet of simple or clear foods, such as broths and crackers. You may also need medicines for pain or nausea or both. If you have cholecystitis and an infection, you will need antibiotics. A hospital stay, if needed for pain control or for cholecystitis with severe infection. Cholecystectomy, or surgery to remove your gallbladder. This is the most common treatment if all other treatments have not worked. Medicines to break up gallstones. These are most effective at treating small gallstones. Medicines may be used for up to 6-12  months. Endoscopic retrograde cholangiopancreatogram. A small basket can be attached to the endoscope and used to capture and remove gallstones, mainly those that are in the common bile duct. Follow these instructions at home: Medicines Take over-the-counter and prescription medicines only as told by your health care provider. If you were prescribed an antibiotic medicine, take it as told by your health care provider. Do not stop taking the antibiotic even if you start to feel better. Ask your health care provider if the medicine prescribed to you requires you to avoid driving or using machinery. Eating and drinking Drink enough fluid to keep your urine pale yellow. This is important during a gallbladder attack. Water and clear liquids are preferred. Follow a healthy diet. This includes: Reducing fatty foods, such as fried food and foods high in cholesterol. Reducing refined carbohydrates, such as white bread and white rice. Eating more fiber. Aim for foods such as almonds, fruit, and beans. Alcohol use If you drink alcohol: Limit how much you use to: 0-1 drink a day for nonpregnant women. 0-2 drinks a day for men. Be aware of how much alcohol is in your drink. In the [place] one drink equals one 12 oz bottle of beer (355 mL), one 5 oz glass of wine (148 mL), or one 1 oz glass of hard liquor (44 mL). General instructions Do not use any products that contain nicotine or tobacco, such as cigarettes, e-cigarettes, and chewing tobacco. If you need help quitting, ask your health care provider. Maintain a healthy weight. Keep all follow-up visits as told by your health care provider. These may include consultations with a surgeon or specialist. This is important. Where to find more information [employer] of Diabetes and Digestive and Kidney Diseases: [email or web address] Contact a health care provider if: You think you have had a gallbladder attack. You have been diagnosed with silent  gallstones and you develop pain in your abdomen or indigestion. You begin to have attacks more often. You have dark urine or light-colored stools. Get help right away if: You have pain from a gallbladder attack that lasts for more than 2 hours. You have pain in your abdomen that lasts for more than 5 hours or is getting worse. You have a fever or chills. You have nausea and vomiting that do not go away. You develop jaundice. Summary Cholelithiasis is a disease in which gallstones form in the gallbladder. This condition may be caused by an imbalance in the different parts that make bile. This can happen if your bile has too much bilirubin or cholesterol, or does not have enough bile salts. Treatment for gallstones depends on the severity of the condition. Silent gallstones do not need treatment. If gallstones cause a gallbladder attack or other symptoms, treatment usually involves not eating or drinking anything. Treatment may also include pain medicines and antibiotics, and it sometimes includes a hospital stay. Surgery to remove the gallbladder is common if all other treatments  have not worked. This information is not intended to replace advice given to you by your health care provider. Make sure you discuss any questions you have with your health care provider. Document Revised: 09/16/2019 Document Reviewed: 09/16/2019 Elsevier Patient Education  2022 [employer].

## 2022-01-25 NOTE — Progress Notes (Unsigned)
Cardiology clearance has been received from Dr Juliann Pares. The patient is cleared at Low risk. He may hold his Brilinta and Aspirin for 5 days prior to surgery and restart 2 days afterwards.  ?

## 2023-06-29 IMAGING — US US ABDOMEN LIMITED
1 series · 14 of 25 positions shown · non-contrast
Comparison: None.

CLINICAL DATA: Abdominal pain

EXAM:
ULTRASOUND ABDOMEN LIMITED RIGHT UPPER QUADRANT

[Series 1: us abdomen limited ruq (liver/gb) · 14 of 40 slices shown]
[im 1/40]
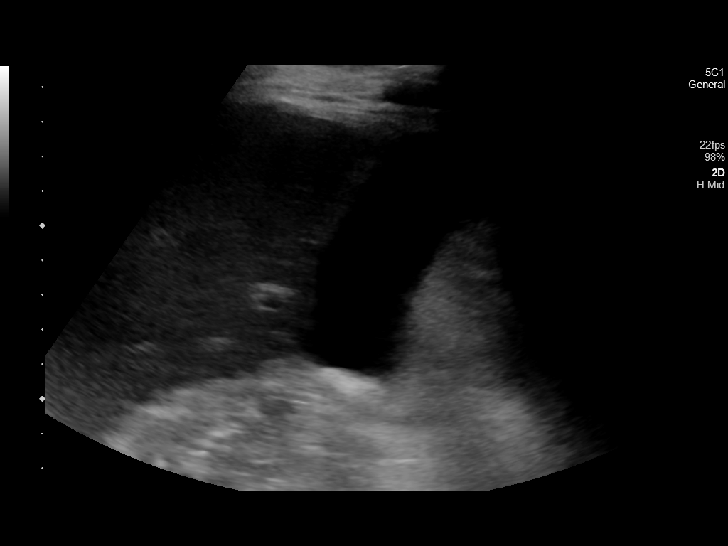
[im 4/40]
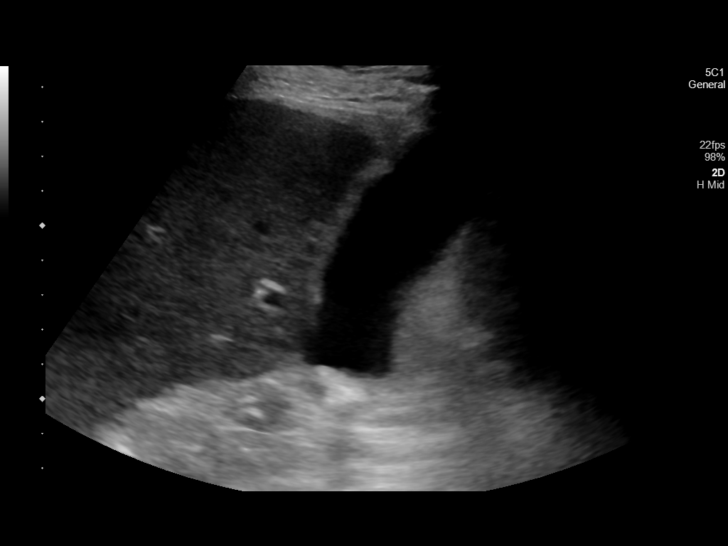
[im 7/40]
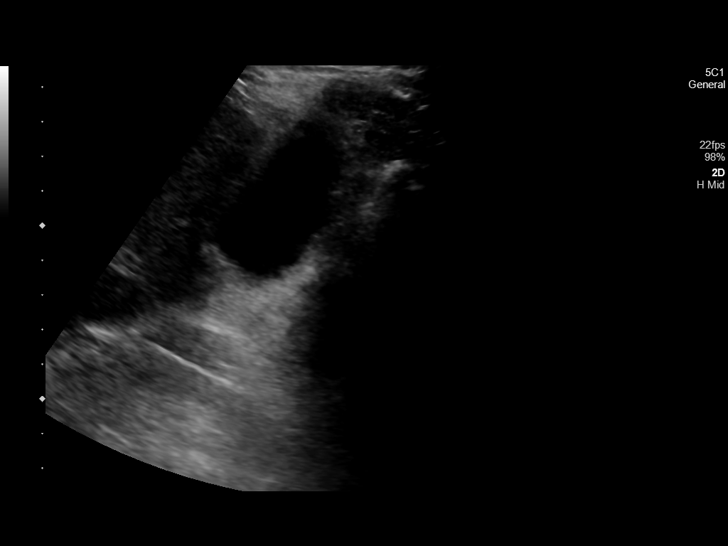
[im 10/40]
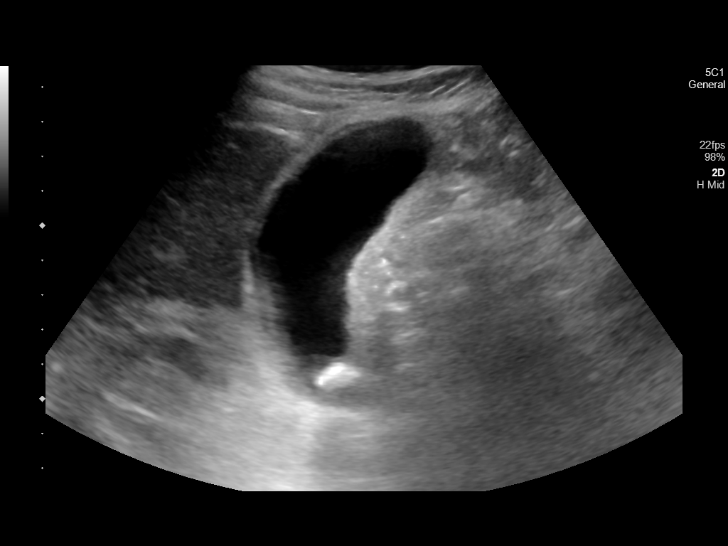
[im 14/40]
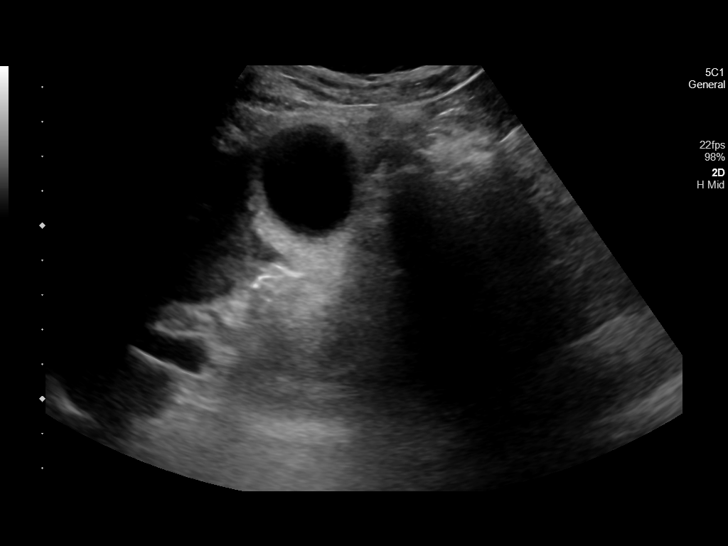
[im 15/40]
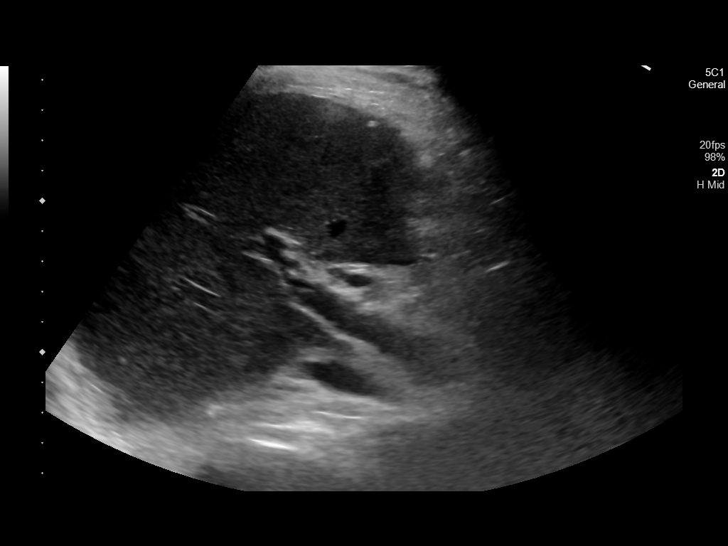
[im 18/40]
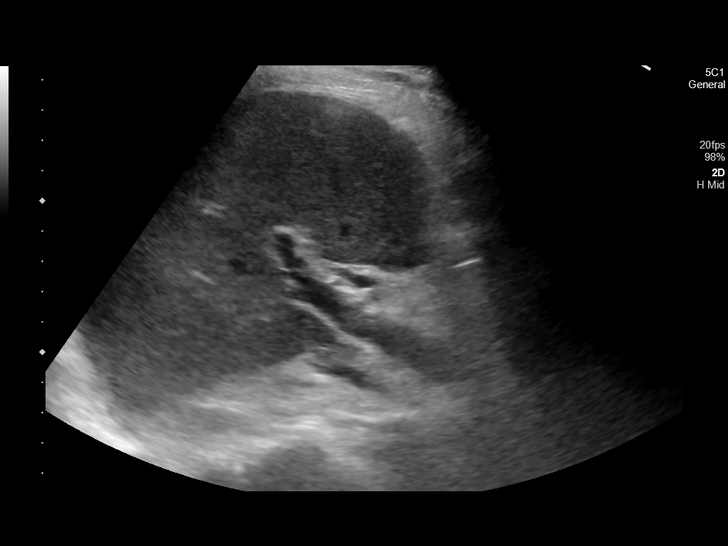
[im 22/40]
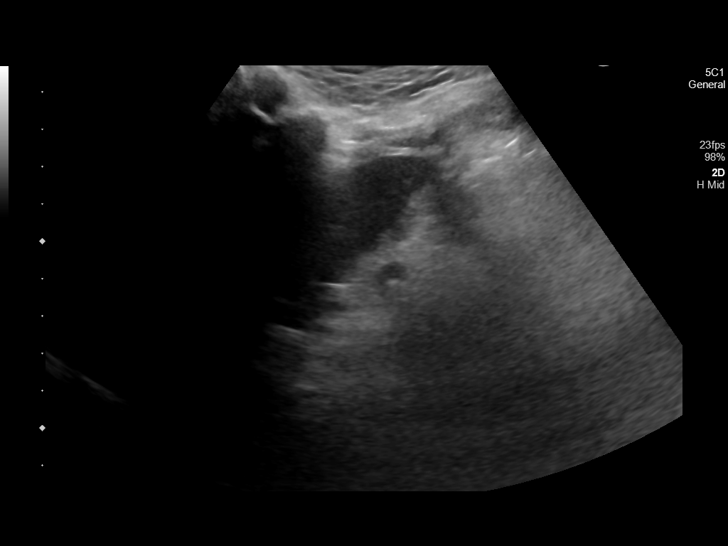
[im 25/40]
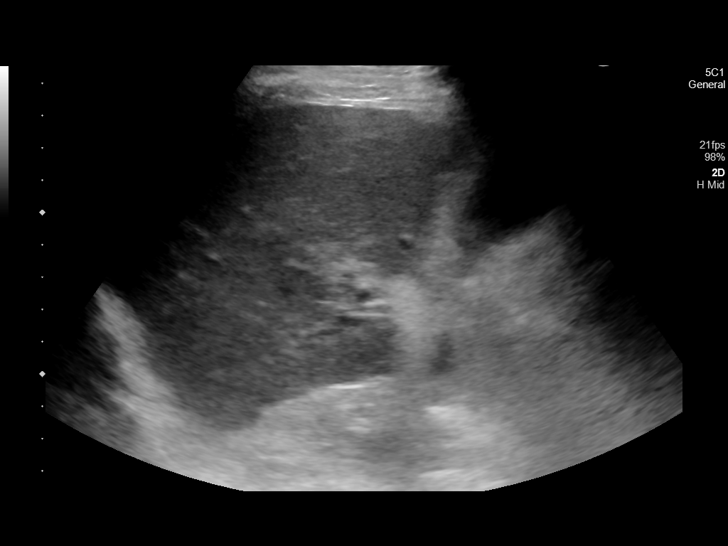
[im 27/40]
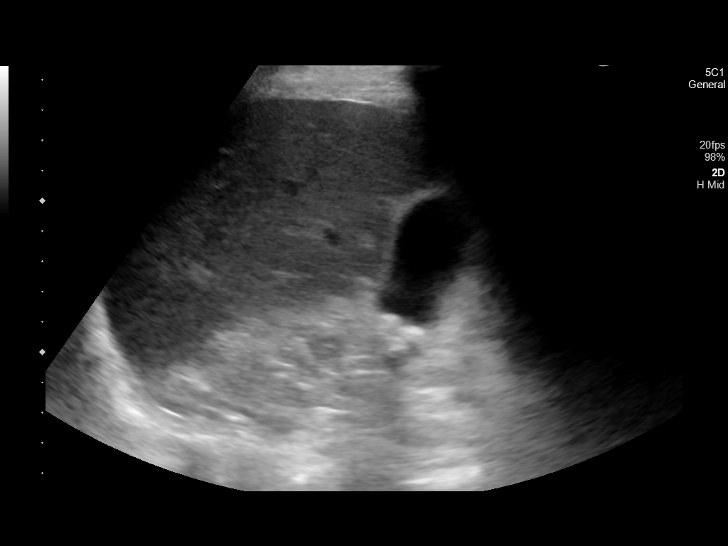
[im 30/40]
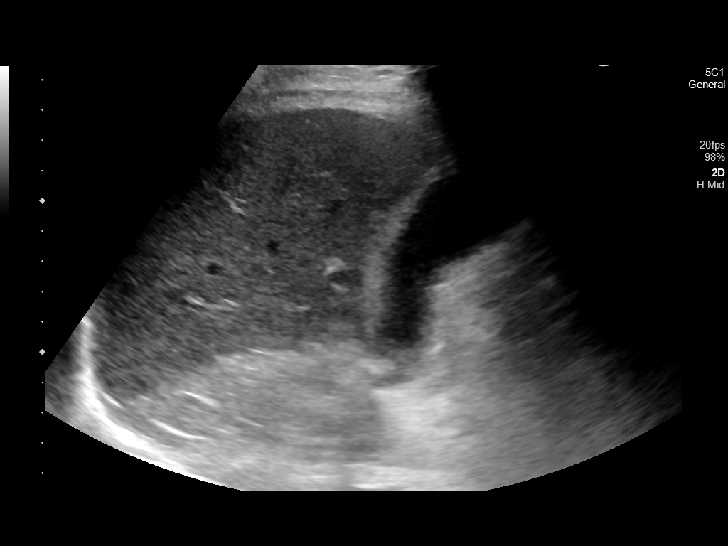
[im 33/40]
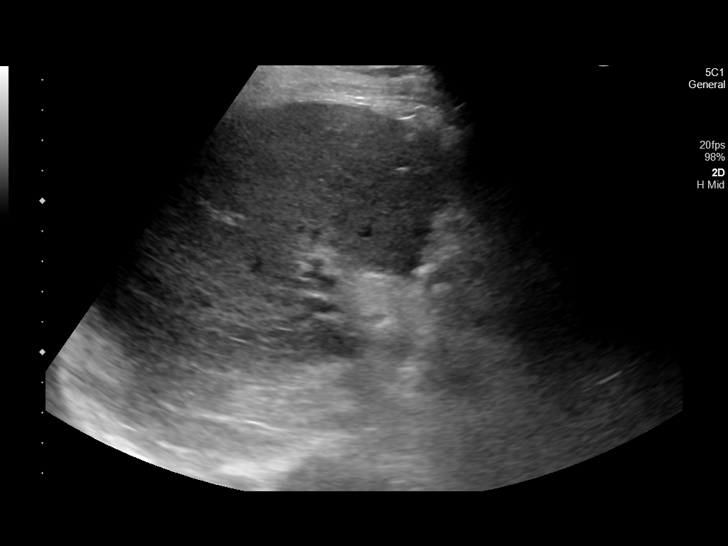
[im 36/40]
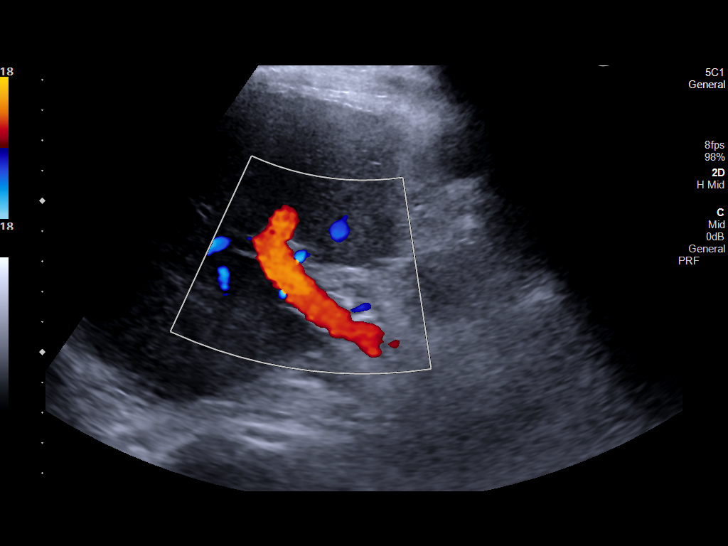
[im 40/40]
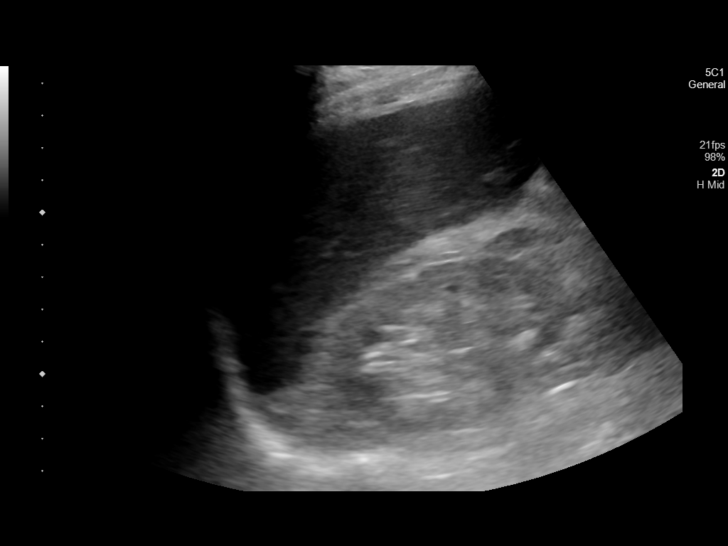

[14 of 25 positions shown; findings below may reference images not displayed]

FINDINGS: Gallbladder:

Well distended with multiple gallstones. Negative sonographic
Murphy's sign is elicited.

Common bile duct:

Diameter: 5.1 mm.

Liver:

No focal lesion identified. Within normal limits in parenchymal
echogenicity. Portal vein is patent on color Doppler imaging with
normal direction of blood flow towards the liver.

Other: None.
IMPRESSION: Cholelithiasis without complicating factors.

## 2023-07-13 ENCOUNTER — Emergency Department
Admission: EM | Admit: 2023-07-13 | Discharge: 2023-07-13 | Disposition: A | Discharge status: Home / Self Care | Payer: Medicare HMO | Attending: Emergency Medicine | Admitting: Emergency Medicine

## 2023-07-13 ENCOUNTER — Other Ambulatory Visit: Payer: Self-pay

## 2023-07-13 ENCOUNTER — Emergency Department: Payer: Medicare HMO

## 2023-07-13 ENCOUNTER — Encounter: Payer: Self-pay | Admitting: Emergency Medicine

## 2023-07-13 DIAGNOSIS — M79672 Pain in left foot: Secondary | ICD-10-CM | POA: Diagnosis present

## 2023-07-13 DIAGNOSIS — E1122 Type 2 diabetes mellitus with diabetic chronic kidney disease: Secondary | ICD-10-CM | POA: Diagnosis not present

## 2023-07-13 DIAGNOSIS — I129 Hypertensive chronic kidney disease with stage 1 through stage 4 chronic kidney disease, or unspecified chronic kidney disease: Secondary | ICD-10-CM | POA: Diagnosis not present

## 2023-07-13 DIAGNOSIS — N189 Chronic kidney disease, unspecified: Secondary | ICD-10-CM | POA: Diagnosis not present

## 2023-07-13 DIAGNOSIS — F0390 Unspecified dementia without behavioral disturbance: Secondary | ICD-10-CM | POA: Insufficient documentation

## 2023-07-13 LAB — CBC WITH DIFFERENTIAL/PLATELET
Abs Immature Granulocytes: 0.03 10*3/uL (ref 0.00–0.07)
Basophils Absolute: 0.1 10*3/uL (ref 0.0–0.1)
Basophils Relative: 1 %
Eosinophils Absolute: 0.5 10*3/uL (ref 0.0–0.5)
Eosinophils Relative: 4 %
HCT: 47.2 % (ref 39.0–52.0)
Hemoglobin: 14.5 g/dL (ref 13.0–17.0)
Immature Granulocytes: 0 %
Lymphocytes Relative: 34 %
Lymphs Abs: 3.8 10*3/uL (ref 0.7–4.0)
MCH: 25.2 pg — ABNORMAL LOW (ref 26.0–34.0)
MCHC: 30.7 g/dL (ref 30.0–36.0)
MCV: 82.1 fL (ref 80.0–100.0)
Monocytes Absolute: 0.6 10*3/uL (ref 0.1–1.0)
Monocytes Relative: 5 %
Neutro Abs: 6.3 10*3/uL (ref 1.7–7.7)
Neutrophils Relative %: 56 %
Platelets: 189 10*3/uL (ref 150–400)
RBC: 5.75 MIL/uL (ref 4.22–5.81)
RDW: 14.2 % (ref 11.5–15.5)
WBC: 11.3 10*3/uL — ABNORMAL HIGH (ref 4.0–10.5)
nRBC: 0 % (ref 0.0–0.2)

## 2023-07-13 LAB — COMPREHENSIVE METABOLIC PANEL
ALT: 17 U/L (ref 0–44)
AST: 26 U/L (ref 15–41)
Albumin: 3.7 g/dL (ref 3.5–5.0)
Alkaline Phosphatase: 94 U/L (ref 38–126)
Anion gap: 9 (ref 5–15)
BUN: 21 mg/dL (ref 8–23)
CO2: 22 mmol/L (ref 22–32)
Calcium: 9.1 mg/dL (ref 8.9–10.3)
Chloride: 106 mmol/L (ref 98–111)
Creatinine, Ser: 1.66 mg/dL — ABNORMAL HIGH (ref 0.61–1.24)
GFR, Estimated: 39 mL/min — ABNORMAL LOW (ref >60)
Glucose, Bld: 85 mg/dL (ref 70–99)
Potassium: 5.2 mmol/L — ABNORMAL HIGH (ref 3.5–5.1)
Sodium: 137 mmol/L (ref 135–145)
Total Bilirubin: 1 mg/dL (ref 0.3–1.2)
Total Protein: 8.2 g/dL — ABNORMAL HIGH (ref 6.5–8.1)

## 2023-07-13 MED ORDER — PREDNISONE 10 MG PO TABS: 20.0000 mg | ORAL_TABLET | Freq: Every day | ORAL | 0 refills | Status: AC

## 2023-07-13 NOTE — Discharge Instructions (Addendum)
Please take the steroids as prescribed.  You can follow-up with your primary care provider as needed.

## 2023-07-13 NOTE — ED Provider Notes (Signed)
[facility] Provider Note    Event Date/Time   First MD Initiated Contact with Patient 07/13/23 1050     (approximate)   History   Foot Pain   HPI  Frank Jefferson is a 89 y.o. male PMH of alcohol abuse, CKD, dementia, diabetes, gout and hypertension who presents for evaluation of bilateral foot pain.  Patient's daughter states that the nurse at his care facility told her that he was in severe pain last night.  He was given Tylenol which did not help much.  Today patient reports his pain is not as severe as it was last night.  He described the pain as a burning pain all throughout his feet.  His daughter also raises concerns about his creatinine levels and wants to know what they are.  She states that he has not had blood work done recently.     Physical Exam   Triage Vital Signs: ED Triage Vitals  Encounter Vitals Group     BP 07/13/23 1011 122/64     Systolic BP Percentile --      Diastolic BP Percentile --      Pulse Rate 07/13/23 1011 (!) 45     Resp 07/13/23 1011 16     Temp 07/13/23 1011 97.8 F (36.6 C)     Temp Source 07/13/23 1011 Oral     SpO2 07/13/23 1011 96 %     Weight 07/13/23 1000 145 lb 8.1 oz (66 kg)     Height 07/13/23 1000 5\' 4"  (1.626 m)     Head Circumference --      Peak Flow --      Pain Score 07/13/23 1000 10     Pain Loc --      Pain Education --      Exclude from Growth Chart --     Most recent vital signs: Vitals:   07/13/23 1011  BP: 122/64  Pulse: (!) 45  Resp: 16  Temp: 97.8 F (36.6 C)  SpO2: 96%    General: Awake, no distress.  CV:  Good peripheral perfusion.  RRR. Resp:  Normal effort.  CTAB. Abd:  No distention.  Other:  No tenderness to palpation across any bones of the bilateral foot and ankle.  Sensation intact across all dermatomes.  5/5 strength.  Dorsalis pedis pulses are 2+ and regular.   ED Results / Procedures / Treatments   Labs (all labs ordered are listed, but only abnormal  results are displayed) Labs Reviewed  COMPREHENSIVE METABOLIC PANEL - Abnormal; Notable for the following components:      Result Value   Potassium 5.2 (*)    Creatinine, Ser 1.66 (*)    Total Protein 8.2 (*)    GFR, Estimated 39 (*)    All other components within normal limits  CBC WITH DIFFERENTIAL/PLATELET - Abnormal; Notable for the following components:   WBC 11.3 (*)    MCH 25.2 (*)    All other components within normal limits     RADIOLOGY  Left foot x-ray obtained, interpreted the images as well as reviewed the radiologist report which was negative.  PROCEDURES:  Critical Care performed: No  Procedures   MEDICATIONS ORDERED IN ED: Medications - No data to display   IMPRESSION / MDM / ASSESSMENT AND PLAN / ED COURSE  I reviewed the triage vital signs and the nursing notes.  89 year old male presents for evaluation of foot pain.  VSS in triage patient NAD on exam.  Differential diagnosis includes, but is not limited to, arthritis, gout, neuropathy, fracture.  Patient's presentation is most consistent with acute complicated illness / injury requiring diagnostic workup.  Left foot x-rays obtained, interpreted the images as well as reviewed the radiologist report which was negative for fracture.  Patient does have calcaneal spur and degenerative changes of the first MTP joint.  Basic blood work obtained at the request of patient's daughter.  CMP shows elevated creatinine and decreased GFR.  CBC shows mild leukocytosis.  I do not suspect an acute gout flare as patient is not tender on exam.  There is no swelling or erythema at the MTP joint.  I suspect that his pain is due to arthritis.  I will treat him with a short course of oral steroids as he cannot have NSAIDs due to his chronic kidney disease.  Patient was agreeable to plan, all questions were answered and he was stable at discharge.      FINAL CLINICAL IMPRESSION(S) / ED  DIAGNOSES   Final diagnoses:  Foot pain, left     Rx / DC Orders   ED Discharge Orders          Ordered    predniSONE (DELTASONE) 10 MG tablet  Daily        07/13/23 1242             Note:  This document was prepared using Dragon voice recognition software and may include unintentional dictation errors.   Cameron Ali, PA-C 07/13/23 1349    Minna Antis, MD 07/13/23 [phone]

## 2023-07-13 NOTE — ED Triage Notes (Signed)
Patient to ED via POV with wife from [facility] to left foot pain. Denies injury or redness or swelling. Ongoing x2 days. Ambulatory with cane- baseline.

## 2023-07-17 NOTE — Group Note (Deleted)

## 2024-05-28 ENCOUNTER — Other Ambulatory Visit: Payer: Self-pay

## 2024-05-28 ENCOUNTER — Inpatient Hospital Stay
Admission: EM | Admit: 2024-05-28 | Discharge: 2024-05-31 | DRG: 683 | Disposition: A | Discharge status: Nursing Home (Includes ICF) | Attending: Internal Medicine | Admitting: Internal Medicine

## 2024-05-28 ENCOUNTER — Encounter: Payer: Self-pay | Admitting: Emergency Medicine

## 2024-05-28 ENCOUNTER — Observation Stay

## 2024-05-28 DIAGNOSIS — E1151 Type 2 diabetes mellitus with diabetic peripheral angiopathy without gangrene: Secondary | ICD-10-CM | POA: Diagnosis present

## 2024-05-28 DIAGNOSIS — Z880 Allergy status to penicillin: Secondary | ICD-10-CM

## 2024-05-28 DIAGNOSIS — Z8249 Family history of ischemic heart disease and other diseases of the circulatory system: Secondary | ICD-10-CM

## 2024-05-28 DIAGNOSIS — E039 Hypothyroidism, unspecified: Secondary | ICD-10-CM | POA: Diagnosis present

## 2024-05-28 DIAGNOSIS — E785 Hyperlipidemia, unspecified: Secondary | ICD-10-CM | POA: Diagnosis not present

## 2024-05-28 DIAGNOSIS — E1122 Type 2 diabetes mellitus with diabetic chronic kidney disease: Secondary | ICD-10-CM | POA: Diagnosis present

## 2024-05-28 DIAGNOSIS — E1129 Type 2 diabetes mellitus with other diabetic kidney complication: Secondary | ICD-10-CM | POA: Diagnosis present

## 2024-05-28 DIAGNOSIS — I130 Hypertensive heart and chronic kidney disease with heart failure and stage 1 through stage 4 chronic kidney disease, or unspecified chronic kidney disease: Secondary | ICD-10-CM | POA: Diagnosis present

## 2024-05-28 DIAGNOSIS — N1832 Chronic kidney disease, stage 3b: Secondary | ICD-10-CM | POA: Diagnosis present

## 2024-05-28 DIAGNOSIS — N170 Acute kidney failure with tubular necrosis: Principal | ICD-10-CM | POA: Diagnosis present

## 2024-05-28 DIAGNOSIS — Z881 Allergy status to other antibiotic agents status: Secondary | ICD-10-CM

## 2024-05-28 DIAGNOSIS — I2510 Atherosclerotic heart disease of native coronary artery without angina pectoris: Secondary | ICD-10-CM | POA: Diagnosis present

## 2024-05-28 DIAGNOSIS — Z7902 Long term (current) use of antithrombotics/antiplatelets: Secondary | ICD-10-CM

## 2024-05-28 DIAGNOSIS — I10 Essential (primary) hypertension: Secondary | ICD-10-CM | POA: Diagnosis present

## 2024-05-28 DIAGNOSIS — I252 Old myocardial infarction: Secondary | ICD-10-CM

## 2024-05-28 DIAGNOSIS — Z79899 Other long term (current) drug therapy: Secondary | ICD-10-CM

## 2024-05-28 DIAGNOSIS — F1091 Alcohol use, unspecified, in remission: Secondary | ICD-10-CM | POA: Diagnosis present

## 2024-05-28 DIAGNOSIS — I5032 Chronic diastolic (congestive) heart failure: Secondary | ICD-10-CM | POA: Diagnosis present

## 2024-05-28 DIAGNOSIS — E8721 Acute metabolic acidosis: Secondary | ICD-10-CM | POA: Diagnosis present

## 2024-05-28 DIAGNOSIS — F1010 Alcohol abuse, uncomplicated: Secondary | ICD-10-CM

## 2024-05-28 DIAGNOSIS — N179 Acute kidney failure, unspecified: Secondary | ICD-10-CM | POA: Diagnosis not present

## 2024-05-28 DIAGNOSIS — F0390 Unspecified dementia without behavioral disturbance: Secondary | ICD-10-CM | POA: Diagnosis present

## 2024-05-28 DIAGNOSIS — D696 Thrombocytopenia, unspecified: Secondary | ICD-10-CM | POA: Diagnosis present

## 2024-05-28 LAB — URINALYSIS, ROUTINE W REFLEX MICROSCOPIC
Bilirubin Urine: NEGATIVE
Glucose, UA: NEGATIVE mg/dL
Hgb urine dipstick: NEGATIVE
Ketones, ur: NEGATIVE mg/dL
Leukocytes,Ua: NEGATIVE
Nitrite: NEGATIVE
Protein, ur: NEGATIVE mg/dL
Specific Gravity, Urine: 1.008 (ref 1.005–1.030)
pH: 7 (ref 5.0–8.0)

## 2024-05-28 LAB — BASIC METABOLIC PANEL WITH GFR
Anion gap: 13 (ref 5–15)
BUN: 43 mg/dL — ABNORMAL HIGH (ref 8–23)
CO2: 13 mmol/L — ABNORMAL LOW (ref 22–32)
Calcium: 9.2 mg/dL (ref 8.9–10.3)
Chloride: 107 mmol/L (ref 98–111)
Creatinine, Ser: 3.52 mg/dL — ABNORMAL HIGH (ref 0.61–1.24)
GFR, Estimated: 16 mL/min — ABNORMAL LOW (ref >60)
Glucose, Bld: 97 mg/dL (ref 70–99)
Potassium: 5.1 mmol/L (ref 3.5–5.1)
Sodium: 133 mmol/L — ABNORMAL LOW (ref 135–145)

## 2024-05-28 LAB — CBC WITH DIFFERENTIAL/PLATELET
Abs Immature Granulocytes: 0.03 K/uL (ref 0.00–0.07)
Basophils Absolute: 0.1 K/uL (ref 0.0–0.1)
Basophils Relative: 1 %
Eosinophils Absolute: 0.4 K/uL (ref 0.0–0.5)
Eosinophils Relative: 4 %
HCT: 47.1 % (ref 39.0–52.0)
Hemoglobin: 14.6 g/dL (ref 13.0–17.0)
Immature Granulocytes: 0 %
Lymphocytes Relative: 25 %
Lymphs Abs: 2.4 K/uL (ref 0.7–4.0)
MCH: 25.4 pg — ABNORMAL LOW (ref 26.0–34.0)
MCHC: 31 g/dL (ref 30.0–36.0)
MCV: 81.9 fL (ref 80.0–100.0)
Monocytes Absolute: 0.7 K/uL (ref 0.1–1.0)
Monocytes Relative: 8 %
Neutro Abs: 6.1 K/uL (ref 1.7–7.7)
Neutrophils Relative %: 62 %
Platelets: 142 K/uL — ABNORMAL LOW (ref 150–400)
RBC: 5.75 MIL/uL (ref 4.22–5.81)
RDW: 15.4 % (ref 11.5–15.5)
WBC: 9.8 K/uL (ref 4.0–10.5)
nRBC: 0 % (ref 0.0–0.2)

## 2024-05-28 LAB — CREATININE, URINE, RANDOM: Creatinine, Urine: 46 mg/dL

## 2024-05-28 LAB — SODIUM, URINE, RANDOM: Sodium, Ur: 100 mmol/L

## 2024-05-28 LAB — BRAIN NATRIURETIC PEPTIDE: B Natriuretic Peptide: 183.9 pg/mL — ABNORMAL HIGH (ref 0.0–100.0)

## 2024-05-28 MED ORDER — MAGNESIUM OXIDE -MG SUPPLEMENT 400 (240 MG) MG PO TABS
400.0000 mg | ORAL_TABLET | Freq: Three times a day (TID) | ORAL | Status: DC
  Administered 2024-05-28 – 2024-05-31 (×8): 400 mg via ORAL
  Filled 2024-05-28 (×8): qty 1

## 2024-05-28 MED ORDER — POLYVINYL ALCOHOL 1.4 % OP SOLN
2.0000 [drp] | Freq: Every day | OPHTHALMIC | Status: DC
  Administered 2024-05-29 – 2024-05-31 (×3): 2 [drp] via OPHTHALMIC
  Filled 2024-05-28: qty 15

## 2024-05-28 MED ORDER — ASPIRIN 81 MG PO CHEW
81.0000 mg | CHEWABLE_TABLET | Freq: Every day | ORAL | Status: DC
  Administered 2024-05-29 – 2024-05-31 (×3): 81 mg via ORAL
  Filled 2024-05-28 (×3): qty 1

## 2024-05-28 MED ORDER — ATORVASTATIN CALCIUM 20 MG PO TABS
80.0000 mg | ORAL_TABLET | Freq: Every day | ORAL | Status: DC
  Administered 2024-05-29 – 2024-05-30 (×2): 80 mg via ORAL
  Filled 2024-05-28 (×2): qty 4

## 2024-05-28 MED ORDER — ONDANSETRON HCL 4 MG/2ML IJ SOLN: 4.0000 mg | Freq: Three times a day (TID) | INTRAMUSCULAR | Status: DC | PRN

## 2024-05-28 MED ORDER — ALLOPURINOL 100 MG PO TABS
100.0000 mg | ORAL_TABLET | Freq: Every day | ORAL | Status: DC
  Administered 2024-05-29 – 2024-05-31 (×3): 100 mg via ORAL
  Filled 2024-05-28 (×3): qty 1

## 2024-05-28 MED ORDER — ACETAMINOPHEN 325 MG PO TABS: 650.0000 mg | ORAL_TABLET | Freq: Four times a day (QID) | ORAL | Status: DC | PRN

## 2024-05-28 MED ORDER — METOPROLOL TARTRATE 25 MG PO TABS
12.5000 mg | ORAL_TABLET | Freq: Two times a day (BID) | ORAL | Status: DC
  Administered 2024-05-28 – 2024-05-31 (×5): 12.5 mg via ORAL
  Filled 2024-05-28 (×6): qty 1

## 2024-05-28 MED ORDER — VITAMIN D3 25 MCG (1000 UNIT) PO TABS
2000.0000 [IU] | ORAL_TABLET | Freq: Every day | ORAL | Status: DC
  Administered 2024-05-29 – 2024-05-31 (×3): 2000 [IU] via ORAL
  Filled 2024-05-28 (×6): qty 2

## 2024-05-28 MED ORDER — HYDRALAZINE HCL 10 MG PO TABS
10.0000 mg | ORAL_TABLET | Freq: Two times a day (BID) | ORAL | Status: DC
  Administered 2024-05-28 – 2024-05-31 (×5): 10 mg via ORAL
  Filled 2024-05-28 (×6): qty 1

## 2024-05-28 MED ORDER — DICYCLOMINE HCL 10 MG PO CAPS
10.0000 mg | ORAL_CAPSULE | Freq: Four times a day (QID) | ORAL | Status: DC
  Administered 2024-05-28 – 2024-05-31 (×10): 10 mg via ORAL
  Filled 2024-05-28 (×10): qty 1

## 2024-05-28 MED ORDER — HEPARIN SODIUM (PORCINE) 5000 UNIT/ML IJ SOLN
5000.0000 [IU] | Freq: Three times a day (TID) | INTRAMUSCULAR | Status: DC
  Administered 2024-05-28 – 2024-05-31 (×7): 5000 [IU] via SUBCUTANEOUS
  Filled 2024-05-28 (×7): qty 1

## 2024-05-28 MED ORDER — BRIMONIDINE TARTRATE 0.2 % OP SOLN
1.0000 [drp] | Freq: Two times a day (BID) | OPHTHALMIC | Status: DC
  Administered 2024-05-29 – 2024-05-31 (×5): 1 [drp] via OPHTHALMIC
  Filled 2024-05-28: qty 5

## 2024-05-28 MED ORDER — MIRTAZAPINE 15 MG PO TABS
15.0000 mg | ORAL_TABLET | Freq: Every day | ORAL | Status: DC
  Administered 2024-05-28 – 2024-05-30 (×3): 15 mg via ORAL
  Filled 2024-05-28 (×3): qty 1

## 2024-05-28 MED ORDER — CALCIUM CARBONATE 1250 (500 CA) MG PO TABS
600.0000 mg | ORAL_TABLET | Freq: Every day | ORAL | Status: DC
  Administered 2024-05-29 – 2024-05-31 (×3): 625 mg via ORAL
  Filled 2024-05-28 (×3): qty 1

## 2024-05-28 MED ORDER — AMLODIPINE BESYLATE 10 MG PO TABS
10.0000 mg | ORAL_TABLET | Freq: Every day | ORAL | Status: DC
  Administered 2024-05-29 – 2024-05-31 (×3): 10 mg via ORAL
  Filled 2024-05-28 (×3): qty 1

## 2024-05-28 MED ORDER — OMEGA-3-ACID ETHYL ESTERS 1 G PO CAPS
1.0000 g | ORAL_CAPSULE | Freq: Every day | ORAL | Status: DC
  Administered 2024-05-29 – 2024-05-31 (×3): 1 g via ORAL
  Filled 2024-05-28 (×3): qty 1

## 2024-05-28 MED ORDER — SODIUM CHLORIDE 0.9 % IV SOLN: INTRAVENOUS | Status: DC

## 2024-05-28 MED ORDER — LATANOPROST 0.005 % OP SOLN
1.0000 [drp] | Freq: Every day | OPHTHALMIC | Status: DC
  Administered 2024-05-29 – 2024-05-30 (×2): 1 [drp] via OPHTHALMIC
  Filled 2024-05-28: qty 2.5

## 2024-05-28 MED ORDER — TRAMADOL HCL 50 MG PO TABS: 50.0000 mg | ORAL_TABLET | Freq: Four times a day (QID) | ORAL | Status: DC | PRN

## 2024-05-28 MED ORDER — SODIUM CHLORIDE 0.9 % IV BOLUS
1000.0000 mL | Freq: Once | INTRAVENOUS | Status: AC
  Administered 2024-05-28: 1000 mL via INTRAVENOUS

## 2024-05-28 MED ORDER — CLOTRIMAZOLE 1 % EX CREA
1.0000 | TOPICAL_CREAM | Freq: Two times a day (BID) | CUTANEOUS | Status: DC
  Administered 2024-05-29 – 2024-05-31 (×4): 1 via TOPICAL
  Filled 2024-05-28: qty 15

## 2024-05-28 MED ORDER — HYDRALAZINE HCL 20 MG/ML IJ SOLN: 5.0000 mg | INTRAMUSCULAR | Status: DC | PRN

## 2024-05-28 MED ORDER — THIAMINE MONONITRATE 100 MG PO TABS
100.0000 mg | ORAL_TABLET | Freq: Every day | ORAL | Status: DC
  Administered 2024-05-29 – 2024-05-31 (×3): 100 mg via ORAL
  Filled 2024-05-28 (×3): qty 1

## 2024-05-28 MED ORDER — SIMETHICONE 80 MG PO CHEW: 80.0000 mg | CHEWABLE_TABLET | Freq: Four times a day (QID) | ORAL | Status: DC | PRN

## 2024-05-28 NOTE — H&P (Signed)
 History and Physical    Frank Jefferson FMW:[id number] DOB: [birth year] DOA: 05/28/2024  Referring MD/NP/PA:   PCP: [facility]   Patient coming from:  The patient is coming from ALF   Chief Complaint: Abnormal lab  HPI: Frank Jefferson is a 89 y.o. male with medical history significant of CKD-3B, HTN, HLD, Dm, PVD, CAD and STEMI (04/02/24), s/p of DES, dCHF, hypothyroidism, dementia, IBS, carotid artery stenosis, alcohol  use in remission, Korsakoff syndrome, thrombocytopenia, who presents with abnormal lab.  Per patient's sister -in law and brother at the bedside, patient had lab work done in ALF, and found that he has worsening renal function.  Patient is sent to ED for further evaluation and treatment.  Patient is asymptomatic, denies symptoms of UTI, no nausea, vomiting, diarrhea or abdominal pain.  No chest pain, cough, SOB.   Of note, pt had SPEP at 12/20/21 by Dr. Tina of [facility] nephrology, which showed a slight irregularity in the gamma region, which may represent monoclonal protein, but immunofixation Electrophoresis of Serum showed no monoclonal protein detected.  Data reviewed independently and ED Course: pt was found to have worsening renal function with creatinine 3.52, BUN 43 and GFR 60 (recent baseline creatinine 1.77 on 12/20/2021), negative UA, BNP  183.9, potassium 5.1, WBC 9.8, temperature normal, blood pressure 134/67, heart rate 69, RR 17, oxygen saturation 96% on room air.  Patient is placed in telemetry bed for observation.  US -renal: 1. No obstructive uropathy. 2. Thinning of both renal parenchyma with increased echogenicity typical of chronic medical renal disease. 3. Incomplete bladder emptying with postvoid residual of 59 cc, just under half the prevoid bladder volume.     EKG: Not done in ED, will get one.      Review of Systems:   General: no fevers, chills, no body weight gain, fatigue HEENT: no blurry vision, hearing changes or sore  throat Respiratory: no dyspnea, coughing, wheezing CV: no chest pain, no palpitations GI: no nausea, vomiting, abdominal pain, diarrhea, constipation GU: no dysuria, burning on urination, increased urinary frequency, hematuria  Ext: no leg edema Neuro: no unilateral weakness, numbness, or tingling, no vision change or hearing loss Skin: no rash, no skin tear. MSK: No muscle spasm, no deformity, no limitation of range of movement in spin Heme: No easy bruising.  Travel history: No recent long distant travel.   Allergy:  Allergies  Allergen Reactions   Ace Inhibitors Anaphylaxis and Swelling   Ciprofloxacin Anaphylaxis   Penicillins Anaphylaxis    Past Medical History:  Diagnosis Date   Alcohol  abuse    Chronic kidney disease    Hypertension     Past Surgical History:  Procedure Laterality Date   CATARACT EXTRACTION W/PHACO Right 01/17/2018   Procedure: CATARACT EXTRACTION PHACO AND INTRAOCULAR LENS PLACEMENT (IOC) COMPLICATED RIGHT;  Surgeon: Mittie Gaskin, MD;  Location: [facility];  Service: Ophthalmology;  Laterality: Right;   CATARACT EXTRACTION W/PHACO Left 03/14/2018   Procedure: CATARACT EXTRACTION PHACO AND INTRAOCULAR LENS PLACEMENT (IOC) COMPLICATED LEFT;  Surgeon: Mittie Gaskin, MD;  Location: [facility];  Service: Ophthalmology;  Laterality: Left;   CORONARY/GRAFT ACUTE MI REVASCULARIZATION N/A 04/03/2019   Procedure: Coronary/Graft Acute MI Revascularization;  Surgeon: Florencio Cara BIRCH, MD;  Location: [facility] INVASIVE CV [facility];  Service: Cardiovascular;  Laterality: N/A;   FOOT SURGERY Right    metal plate   LEFT HEART CATH AND CORONARY ANGIOGRAPHY N/A 04/03/2019   Procedure: LEFT HEART CATH AND CORONARY ANGIOGRAPHY;  Surgeon: Callwood, Dwayne  D, MD;  Location: [facility] INVASIVE CV LAB;  Service: Cardiovascular;  Laterality: N/A;    Social History:  reports that he has never smoked. His smokeless tobacco use includes snuff. He reports current  alcohol  use. He reports that he does not use drugs.  Family History:  Family History  Problem Relation Age of Onset   Hypertension Mother    Cancer Mother    CAD Mother    Cancer Brother      Prior to Admission medications   Medication Sig Start Date End Date Taking? Authorizing Provider  acetaminophen  (TYLENOL ) 325 MG tablet Take 650 mg by mouth every 4 (four) hours as needed for fever.    [provider]  allopurinol  (ZYLOPRIM ) 100 MG tablet Take 100 mg by mouth daily.    [provider]  alum & mag hydroxide-simeth (MAALOX/MYLANTA) 200-200-20 MG/5ML suspension Take 30 mLs by mouth daily as needed for indigestion or heartburn.    [provider]  amLODipine  (NORVASC ) 5 MG tablet Take 1 tablet (5 mg total) by mouth daily. 04/06/19   Patel, Sona, MD  aspirin  81 MG chewable tablet Chew 1 tablet (81 mg total) by mouth daily. 04/06/19   Patel, Sona, MD  atorvastatin  (LIPITOR ) 80 MG tablet Take 1 tablet (80 mg total) by mouth daily at 6 PM. 04/05/19   Patel, Sona, MD  bismuth subsalicylate (PEPTO BISMOL) 262 MG/15ML suspension Take 15 mLs by mouth every 2 (two) hours as needed for indigestion (max 6 doses in 24 hours).    [provider]  dicyclomine  (BENTYL ) 10 MG capsule Take 10 mg by mouth 4 (four) times daily.    [provider]  folic acid  (FOLVITE ) 1 MG tablet Take 1 mg by mouth daily.    [provider]  loperamide  (IMODIUM ) 2 MG capsule Take 2 mg by mouth as needed for diarrhea or loose stools (max 4 doses).    [provider]  LORazepam  (ATIVAN ) 0.5 MG tablet Take 0.5 mg by mouth 2 (two) times daily as needed for anxiety.    [provider]  magnesium  hydroxide (MILK OF MAGNESIA) 400 MG/5ML suspension Take 30 mLs by mouth daily as needed for mild constipation.    [provider]  magnesium  oxide (MAG-OX) 400 MG tablet Take 400 mg by mouth 3 (three) times daily.    [provider]  Menthol (HONEY  LEMON COUGH DROPS MT) Use as directed 1 tablet in the mouth or throat every 3 (three) hours as needed (cough).    [provider]  metoprolol  tartrate (LOPRESSOR ) 25 MG tablet Take 1 tablet (25 mg total) by mouth 2 (two) times daily. 04/05/19   Patel, Sona, MD  ondansetron  (ZOFRAN -ODT) 4 MG disintegrating tablet Take 1 tablet (4 mg total) by mouth every 6 (six) hours as needed for nausea or vomiting. 11/22/21   Ward, Josette SAILOR, DO  Pseudoephedrine-DM-GG (ROBITUSSIN CF PO) Take 10 mLs by mouth every 6 (six) hours as needed (cough/congestion).    [provider]  psyllium (METAMUCIL) 58.6 % powder Take 1 packet by mouth 2 (two) times a day.    [provider]  Skin Protectants, Misc. (BAZA PROTECT EX) Apply 1 application topically 2 (two) times daily as needed (skin protection).    [provider]  sodium phosphate (FLEET) 7-19 GM/118ML ENEM Place 1 enema rectally daily as needed for severe constipation (not relieved by milk of mag and prune juice).    [provider]  thiamine  (VITAMIN B-1)  100 MG tablet Take 100 mg by mouth daily.    [provider]  ticagrelor  (BRILINTA ) 90 MG TABS tablet Take 1 tablet (90 mg total) by mouth 2 (two) times daily. 04/05/19   Tobie Calix, MD    Physical Exam: Vitals:   05/28/24 1930 05/28/24 2000 05/28/24 2125 05/28/24 2143  BP: 128/70 (!) 145/87  (!) 146/98  Pulse: 70 77  81  Resp:    20  Temp:   97.7 F (36.5 C) 98.1 F (36.7 C)  TempSrc:   Oral Oral  SpO2: 98% 97%  95%  Weight:      Height:       General: Not in acute distress.  Dry mucous membrane HEENT:       Eyes: PERRL, EOMI, no jaundice       ENT: No discharge from the ears and nose, no pharynx injection, no tonsillar enlargement.        Neck: No JVD, no bruit, no mass felt. Heme: No neck lymph node enlargement. Cardiac: S1/S2, RRR, No murmurs, No gallops or rubs. Respiratory: No rales, wheezing, rhonchi or rubs. GI: Soft, nondistended,  nontender, no rebound pain, no organomegaly, BS present. GU: No hematuria Ext: No pitting leg edema bilaterally. 1+DP/PT pulse bilaterally. Musculoskeletal: No joint deformities, No joint redness or warmth, no limitation of ROM in spin. Skin: No rashes.  Neuro: Alert, following command, cranial nerves II-XII grossly intact, moves all extremities normally.  Psych: Patient is not psychotic, no suicidal or hemocidal ideation.  Labs on Admission: I have personally reviewed following labs and imaging studies  CBC: Recent Labs  Lab 05/28/24 1711  WBC 9.8  NEUTROABS 6.1  HGB 14.6  HCT 47.1  MCV 81.9  PLT 142*   Basic Metabolic Panel: Recent Labs  Lab 05/28/24 1711  NA 133*  K 5.1  CL 107  CO2 13*  GLUCOSE 97  BUN 43*  CREATININE 3.52*  CALCIUM  9.2   GFR: Estimated Creatinine Clearance: 13 mL/min (A) (by C-G formula based on SCr of 3.52 mg/dL (H)). Liver Function Tests: No results for input(s): AST, ALT, ALKPHOS, BILITOT, PROT, ALBUMIN in the last 168 hours. No results for input(s): LIPASE, AMYLASE in the last 168 hours. No results for input(s): AMMONIA in the last 168 hours. Coagulation Profile: No results for input(s): INR, PROTIME in the last 168 hours. Cardiac Enzymes: No results for input(s): CKTOTAL, CKMB, CKMBINDEX, TROPONINI in the last 168 hours. BNP (last 3 results) No results for input(s): PROBNP in the last 8760 hours. HbA1C: No results for input(s): HGBA1C in the last 72 hours. CBG: No results for input(s): GLUCAP in the last 168 hours. Lipid Profile: No results for input(s): CHOL, HDL, LDLCALC, TRIG, CHOLHDL, LDLDIRECT in the last 72 hours. Thyroid  Function Tests: No results for input(s): TSH, T4TOTAL, FREET4, T3FREE, THYROIDAB in the last 72 hours. Anemia Panel: No results for input(s): VITAMINB12, FOLATE, FERRITIN, TIBC, IRON, RETICCTPCT in the last 72 hours. Urine analysis:     Component Value Date/Time   COLORURINE STRAW (A) 05/28/2024 2145   APPEARANCEUR CLEAR (A) 05/28/2024 2145   APPEARANCEUR Cloudy (A) 02/26/2018 1448   LABSPEC 1.008 05/28/2024 2145   LABSPEC 1.012 04/23/2014 1606   PHURINE 7.0 05/28/2024 2145   GLUCOSEU NEGATIVE 05/28/2024 2145   GLUCOSEU Negative 04/23/2014 1606   HGBUR NEGATIVE 05/28/2024 2145   BILIRUBINUR NEGATIVE 05/28/2024 2145   BILIRUBINUR Negative 02/26/2018 1448   BILIRUBINUR Negative 04/23/2014 1606   KETONESUR NEGATIVE 05/28/2024 2145   PROTEINUR NEGATIVE  05/28/2024 2145   NITRITE NEGATIVE 05/28/2024 2145   LEUKOCYTESUR NEGATIVE 05/28/2024 2145   LEUKOCYTESUR Negative 04/23/2014 1606   Sepsis Labs: @LABRCNTIP (procalcitonin:4,lacticidven:4) )No results found for this or any previous visit (from the past 240 hours).   Radiological Exams on Admission:   Assessment/Plan Principal Problem:   Acute kidney injury superimposed on stage 3b chronic kidney disease (HCC) Active Problems:   HTN (hypertension)   CAD (coronary artery disease)   Chronic diastolic CHF (congestive heart failure) (HCC)   Hyperlipidemia   Type II diabetes mellitus with renal manifestations (HCC)   Thrombocytopenia (HCC)   Assessment and Plan:  Acute kidney injury superimposed on stage 3b chronic kidney disease (HCC): Etiology is not clear.  No urinary retention or obstruction by renal ultrasound. Pt recently took Bactrim which has at least partially contributed.  UA negative, will hold off antibiotics.  -Place in telemetry bed for observation - Avoid using renal toxic medications - IV fluid: 1 L normal saline, NS 75 cc/h -Check FeNa - Consulted Dr. Marcelino  HTN (hypertension):  - IV hydralazine  as needed - Amlodipine , metoprolol   CAD (coronary artery disease): S/p of stent placement.  No chest pain -Aspirin , Brilinta , Lipitor , metoprolol   Chronic diastolic CHF (congestive heart failure) (HCC): 2D echo on 01/17/2022 showed EF> 55% with  grade 1 diastolic dysfunction.  Patient does not have leg edema or JVD.  CHF is compensated. -Watch volume status closely  Hyperlipidemia -Lipitor   Diet controlled type II diabetes mellitus with renal manifestations [facility]): Recent A1c 5.4, well-controlled.  Patient not taking medications.  Blood sugar 97 by BMP - No treatment needed.  Thrombocytopenia (HCC): This is chronic issue.  Platelets are 142, no active bleeding. -Follow-up with CBC         DVT ppx: SQ Heparin      Code Status: Full code   Family Communication:   Yes, patient's brother and sister-in-law   at bed side.      Disposition Plan:  Anticipate discharge back to previous environment, ALF  Consults called:  Dr. Marcelino of renal  Admission status and Level of care: Telemetry Medical:    for obs     Dispo: The patient is from: ALF              Anticipated d/c is to: ALF              Anticipated d/c date is: 1 day              Patient currently is not medically stable to d/c.    Severity of Illness:  The appropriate patient status for this patient is OBSERVATION. Observation status is judged to be reasonable and necessary in order to provide the required intensity of service to ensure the patient's safety. The patient's presenting symptoms, physical exam findings, and initial radiographic and laboratory data in the context of their medical condition is felt to place them at decreased risk for further clinical deterioration. Furthermore, it is anticipated that the patient will be medically stable for discharge from the hospital within 2 midnights of admission.        Date of Service 05/28/2024    Caleb Exon [facility]   If 7PM-7AM, please contact night-coverage [email or web address] 05/28/2024, 10:30 PM

## 2024-05-28 NOTE — ED Provider Notes (Signed)
 [facility] Provider Note    Event Date/Time   First MD Initiated Contact with Patient 05/28/24 1755     (approximate)   History   Chief Complaint Abnormal Lab   HPI  Frank Jefferson is a 89 y.o. male with past medical history of hypertension, diabetes, CAD, CKD, alcohol  abuse, Korsakoff syndrome, and dementia who presents to the ED for abnormal labs.  Patient reportedly got labs at his assisted living facility earlier today that showed elevated creatinine.  He was subsequently sent to the ED for further evaluation, but currently denies any complaints.  He denies any fevers, dysuria, abdominal pain, or flank pain.  He states he has been urinating normally recently and is not aware of any recent changes to his medications.  He denies current alcohol  abuse.     Physical Exam   Triage Vital Signs: ED Triage Vitals [05/28/24 1708]  Encounter Vitals Group     BP 134/67     Girls Systolic BP Percentile      Girls Diastolic BP Percentile      Boys Systolic BP Percentile      Boys Diastolic BP Percentile      Pulse Rate 69     Resp 17     Temp 98.8 F (37.1 C)     Temp Source Oral     SpO2 96 %     Weight 145 lb 8.1 oz (66 kg)     Height 5' 7 (1.702 m)     Head Circumference      Peak Flow      Pain Score 0     Pain Loc      Pain Education      Exclude from Growth Chart     Most recent vital signs: Vitals:   05/28/24 1708  BP: 134/67  Pulse: 69  Resp: 17  Temp: 98.8 F (37.1 C)  SpO2: 96%    Constitutional: Awake and alert. Eyes: Conjunctivae are normal. Head: Atraumatic. Nose: No congestion/rhinnorhea. Mouth/Throat: Mucous membranes are moist.  Cardiovascular: Normal rate, regular rhythm. Grossly normal heart sounds.  2+ radial pulses bilaterally. Respiratory: Normal respiratory effort.  No retractions. Lungs CTAB. Gastrointestinal: Soft and nontender. No distention. Musculoskeletal: No lower extremity tenderness nor edema.   Neurologic:  Normal speech and language. No gross focal neurologic deficits are appreciated.    ED Results / Procedures / Treatments   Labs (all labs ordered are listed, but only abnormal results are displayed) Labs Reviewed  CBC WITH DIFFERENTIAL/PLATELET - Abnormal; Notable for the following components:      Result Value   MCH 25.4 (*)    Platelets 142 (*)    All other components within normal limits  BASIC METABOLIC PANEL WITH GFR - Abnormal; Notable for the following components:   Sodium 133 (*)    CO2 13 (*)    BUN 43 (*)    Creatinine, Ser 3.52 (*)    GFR, Estimated 16 (*)    All other components within normal limits  URINALYSIS, ROUTINE W REFLEX MICROSCOPIC    PROCEDURES:  Critical Care performed: No  Procedures   MEDICATIONS ORDERED IN ED: Medications  sodium chloride  0.9 % bolus 1,000 mL (has no administration in time range)     IMPRESSION / MDM / ASSESSMENT AND PLAN / ED COURSE  I reviewed the triage vital signs and the nursing notes.  89 y.o. male with past medical history of hypertension, diabetes, CAD, CAD, alcohol  abuse, Korsakoff syndrome, and dementia who presents to the ED for abnormal labs and elevated creatinine noted on outpatient blood work.  Patient's presentation is most consistent with acute presentation with potential threat to life or bodily function.  Differential diagnosis includes, but is not limited to, renal failure, electrolyte abnormality, urinary retention, UTI, dehydration.  Patient nontoxic-appearing and in no acute distress, vital signs are unremarkable.  He denies any complaints and has a benign abdominal exam, no clinical findings concerning for urinary retention but will check renal ultrasound.  Labs confirm elevated creatinine with significant change from most recent blood work from last year, no acute electrolyte abnormality noted.  No significant anemia or leukocytosis, urinalysis pending at this  time.  We will hydrate with IV fluids, case discussed with hospitalist for admission.      FINAL CLINICAL IMPRESSION(S) / ED DIAGNOSES   Final diagnoses:  AKI (acute kidney injury) (HCC)     Rx / DC Orders   ED Discharge Orders     None        Note:  This document was prepared using Dragon voice recognition software and may include unintentional dictation errors.   Willo Dunnings, MD 05/28/24 [phone]

## 2024-05-28 NOTE — ED Triage Notes (Signed)
 Patient to ED via [facility] from [facility] for a BUN 38 and Cret 3.55. Pt denies any complaints. Hx of kidney disease, dementia

## 2024-05-29 DIAGNOSIS — F0390 Unspecified dementia without behavioral disturbance: Secondary | ICD-10-CM | POA: Diagnosis present

## 2024-05-29 DIAGNOSIS — Z880 Allergy status to penicillin: Secondary | ICD-10-CM | POA: Diagnosis not present

## 2024-05-29 DIAGNOSIS — E785 Hyperlipidemia, unspecified: Secondary | ICD-10-CM | POA: Diagnosis present

## 2024-05-29 DIAGNOSIS — I5032 Chronic diastolic (congestive) heart failure: Secondary | ICD-10-CM | POA: Diagnosis present

## 2024-05-29 DIAGNOSIS — E1151 Type 2 diabetes mellitus with diabetic peripheral angiopathy without gangrene: Secondary | ICD-10-CM | POA: Diagnosis present

## 2024-05-29 DIAGNOSIS — Z881 Allergy status to other antibiotic agents status: Secondary | ICD-10-CM | POA: Diagnosis not present

## 2024-05-29 DIAGNOSIS — E8721 Acute metabolic acidosis: Secondary | ICD-10-CM | POA: Diagnosis present

## 2024-05-29 DIAGNOSIS — I130 Hypertensive heart and chronic kidney disease with heart failure and stage 1 through stage 4 chronic kidney disease, or unspecified chronic kidney disease: Secondary | ICD-10-CM | POA: Diagnosis present

## 2024-05-29 DIAGNOSIS — N179 Acute kidney failure, unspecified: Secondary | ICD-10-CM | POA: Diagnosis present

## 2024-05-29 DIAGNOSIS — Z8249 Family history of ischemic heart disease and other diseases of the circulatory system: Secondary | ICD-10-CM | POA: Diagnosis not present

## 2024-05-29 DIAGNOSIS — Z79899 Other long term (current) drug therapy: Secondary | ICD-10-CM | POA: Diagnosis not present

## 2024-05-29 DIAGNOSIS — D696 Thrombocytopenia, unspecified: Secondary | ICD-10-CM | POA: Diagnosis present

## 2024-05-29 DIAGNOSIS — N1832 Chronic kidney disease, stage 3b: Secondary | ICD-10-CM | POA: Diagnosis present

## 2024-05-29 DIAGNOSIS — I252 Old myocardial infarction: Secondary | ICD-10-CM | POA: Diagnosis not present

## 2024-05-29 DIAGNOSIS — E1122 Type 2 diabetes mellitus with diabetic chronic kidney disease: Secondary | ICD-10-CM | POA: Diagnosis present

## 2024-05-29 DIAGNOSIS — F1091 Alcohol use, unspecified, in remission: Secondary | ICD-10-CM | POA: Diagnosis present

## 2024-05-29 DIAGNOSIS — Z7902 Long term (current) use of antithrombotics/antiplatelets: Secondary | ICD-10-CM | POA: Diagnosis not present

## 2024-05-29 DIAGNOSIS — I2510 Atherosclerotic heart disease of native coronary artery without angina pectoris: Secondary | ICD-10-CM | POA: Diagnosis present

## 2024-05-29 DIAGNOSIS — N170 Acute kidney failure with tubular necrosis: Secondary | ICD-10-CM | POA: Diagnosis present

## 2024-05-29 DIAGNOSIS — E039 Hypothyroidism, unspecified: Secondary | ICD-10-CM | POA: Diagnosis present

## 2024-05-29 LAB — CBC
HCT: 43.4 % (ref 39.0–52.0)
Hemoglobin: 13.6 g/dL (ref 13.0–17.0)
MCH: 25.4 pg — ABNORMAL LOW (ref 26.0–34.0)
MCHC: 31.3 g/dL (ref 30.0–36.0)
MCV: 81.1 fL (ref 80.0–100.0)
Platelets: 148 K/uL — ABNORMAL LOW (ref 150–400)
RBC: 5.35 MIL/uL (ref 4.22–5.81)
RDW: 15.3 % (ref 11.5–15.5)
WBC: 9.8 K/uL (ref 4.0–10.5)
nRBC: 0 % (ref 0.0–0.2)

## 2024-05-29 LAB — BASIC METABOLIC PANEL WITH GFR
Anion gap: 9 (ref 5–15)
BUN: 39 mg/dL — ABNORMAL HIGH (ref 8–23)
CO2: 18 mmol/L — ABNORMAL LOW (ref 22–32)
Calcium: 8.7 mg/dL — ABNORMAL LOW (ref 8.9–10.3)
Chloride: 112 mmol/L — ABNORMAL HIGH (ref 98–111)
Creatinine, Ser: 3.09 mg/dL — ABNORMAL HIGH (ref 0.61–1.24)
GFR, Estimated: 18 mL/min — ABNORMAL LOW (ref >60)
Glucose, Bld: 88 mg/dL (ref 70–99)
Potassium: 5.2 mmol/L — ABNORMAL HIGH (ref 3.5–5.1)
Sodium: 139 mmol/L (ref 135–145)

## 2024-05-29 NOTE — TOC Initial Note (Signed)
 Transition of Care [facility]) - Initial/Assessment Note    Patient Details  Name: Frank Jefferson MRN: [id number] Date of Birth: 06-Apr-1931  [facility] CM/SW Contact:    Corean ONEIDA Haddock, RN Phone Number: 05/29/2024, 1:51 PM  Clinical Narrative:                  Admitted for: AKI Admitted from: [facility]  PCP: [facility] Current home health/prior home health/DME: [facility]  Anticipated dc tomorrow.  [name] with [facility] notified and states they will be able to provide dc transport. Sister in Industrial/product designer in agreement with plan Patient will need Fl2 at discharge          Patient Goals and CMS Choice            Expected Discharge Plan and Services                                              Prior Living Arrangements/Services                       Activities of Daily Living   ADL Screening (condition at time of admission) Independently performs ADLs?: Yes (appropriate for developmental age) Is the patient deaf or have difficulty hearing?: Yes Does the patient have difficulty seeing, even when wearing glasses/contacts?: No Does the patient have difficulty concentrating, remembering, or making decisions?: No  Permission Sought/Granted                  Emotional Assessment              Admission diagnosis:  AKI (acute kidney injury) (HCC) [N17.9] Acute renal failure superimposed on stage 3b chronic kidney disease (HCC) [N17.9, N18.32] Acute kidney injury superimposed on stage 3b chronic kidney disease (HCC) [N17.9, N18.32] Patient Active Problem List   Diagnosis Date Noted   Acute kidney injury superimposed on stage 3b chronic kidney disease (HCC) 05/28/2024   Type II diabetes mellitus with renal manifestations (HCC) 05/28/2024   HTN (hypertension) 05/28/2024   CAD (coronary artery disease) 05/28/2024   Chronic diastolic CHF (congestive heart failure) (HCC) 05/28/2024   Acute renal failure superimposed on  stage 3b chronic kidney disease (HCC) 05/28/2024   Abdominal aortic aneurysm without rupture (HCC) 12/22/2021   Adjustment disorder with mixed anxiety and depressed mood 12/22/2021   Alcohol  abuse 12/22/2021   Dementia (HCC) 12/22/2021   Atherosclerosis of native arteries of extremities with rest pain, bilateral legs (HCC) 12/22/2021   Glaucoma 12/22/2021   Hyperlipidemia 12/22/2021   Hypothyroidism 12/22/2021   Irritable bowel syndrome 12/22/2021   Non-toxic multinodular goiter 12/22/2021   Occlusion and stenosis of bilateral carotid arteries 12/22/2021   Osteoarthritis 12/22/2021   Other long term (current) drug therapy 12/22/2021   Tear film insufficiency 12/22/2021   Type 2 diabetes mellitus without complications (HCC) 12/22/2021   Vitamin D deficiency 12/22/2021   Diabetes mellitus (HCC) 12/20/2021   STEMI (ST elevation myocardial infarction) (HCC) 04/03/2019   Encephalopathy 08/16/2018   Hypomagnesemia 08/16/2018   Peripheral vascular disease (HCC) 08/16/2018   Purulent conjunctivitis of both eyes 08/16/2018   Thrombocytopenia (HCC) 08/16/2018   AKI (acute kidney injury) (HCC) 08/15/2018   Chronic kidney disease, unspecified 11/29/2017   Increased frequency of urination 11/29/2017   Korsakoff syndrome (HCC) 11/29/2017   Shingles 11/29/2017   Alcohol  withdrawal (HCC)  11/21/2017   Chest pain 11/21/2017   Abdominal pain, RLQ (right lower quadrant) 06/30/2015   Cholelithiasis without cholecystitis 04/28/2015   Chronic diarrhea 04/28/2015   Other vitamin B12 deficiency anemias 11/17/2014   PCP:  [employer]:   [facility] - [place], [place] - [address] [place] [address] Phone: [phone] Fax: [phone]  [facility] - [place], [place] - [address] [place] [address] Phone: [phone] Fax: [phone]     Social Drivers of Health (SDOH) Social  History: SDOH Screenings   Food Insecurity: No Food Insecurity (05/29/2024)  Housing: Low Risk  (05/29/2024)  Transportation Needs: No Transportation Needs (05/29/2024)  Utilities: Not At Risk (05/29/2024)  Social Connections: Moderately Isolated (05/29/2024)  Tobacco Use: High Risk (05/28/2024)   SDOH Interventions:     Readmission Risk Interventions     No data to display

## 2024-05-29 NOTE — Progress Notes (Addendum)
 PROGRESS NOTE    Frank Jefferson  FMW:[id number] DOB: 1930/12/09 DOA: 05/28/2024 PCP: [facility]    Brief Narrative:  89 y.o. male with medical history significant of CKD-3B, HTN, HLD, Dm, PVD, CAD and STEMI (04/02/24), s/p of DES, dCHF, hypothyroidism, dementia, IBS, carotid artery stenosis, alcohol  use in remission, Korsakoff syndrome, thrombocytopenia, who presents with abnormal lab.   Per patient's sister -in law and brother at the bedside, patient had lab work done in ALF, and found that he has worsening renal function.  Patient is sent to ED for further evaluation and treatment.  Patient is asymptomatic, denies symptoms of UTI, no nausea, vomiting, diarrhea or abdominal pain.  No chest pain, cough, SOB.     Of note, pt had SPEP at 12/20/21 by Dr. Tina of [facility] nephrology, which showed a slight irregularity in the gamma region, which may represent monoclonal protein, but immunofixation Electrophoresis of Serum showed no monoclonal protein detected.   Assessment & Plan:   Principal Problem:   Acute kidney injury superimposed on stage 3b chronic kidney disease (HCC) Active Problems:   HTN (hypertension)   CAD (coronary artery disease)   Chronic diastolic CHF (congestive heart failure) (HCC)   Hyperlipidemia   Type II diabetes mellitus with renal manifestations (HCC)   Thrombocytopenia (HCC)  Acute kidney injury superimposed on stage 3b chronic kidney disease (HCC): Etiology is not clear.  No urinary retention or obstruction by renal ultrasound. Pt recently took Bactrim which has at least partially contributed.  UA negative, will hold off antibiotics.  Fractional secretion of sodium 5.87%.  I suspect ATN secondary to Bactrim use. Plan: Continue IVF, NS at 75 cc/h Daily BMP Hold off on nephrology consult for today.   If creatinine worsens will engage nephrology 7/24.   HTN (hypertension):  - IV hydralazine  as needed - Amlodipine , metoprolol    CAD (coronary artery  disease):  S/p of stent placement.  No chest pain -Aspirin , Brilinta , Lipitor , metoprolol    Chronic diastolic CHF (congestive heart failure) (HCC):  2D echo on 01/17/2022 showed EF> 55% with grade 1 diastolic dysfunction.  Patient does not have leg edema or JVD.  CHF is compensated. - Monitor closely while on IVF   Hyperlipidemia -Lipitor    Diet controlled type II diabetes mellitus with renal manifestations [facility]): Recent A1c 5.4, well-controlled.  Patient not taking medications.  Blood sugar 97 by BMP - No treatment needed.   Thrombocytopenia (HCC): This is chronic issue.  Platelets are 142, no active bleeding. -Follow-up with CBC   DVT prophylaxis: SQH Code Status: Full Family Communication: Julio America [phone] on 7/23 Disposition Plan: Status is: Observation The patient will require care spanning > 2 midnights and should be moved to inpatient because: AKI on IV fluids.  Anticipate discharge 7/24   Level of care: Telemetry Medical  Consultants:  None  Procedures:  None  Antimicrobials: None   Subjective: Seen and examined.  Resting comfortably in bed.  No visible distress.  No complaints of pain.  Objective: Vitals:   05/28/24 2256 05/29/24 0428 05/29/24 0444 05/29/24 0738  BP: (!) 146/98 (!) 95/55  (!) 128/93  Pulse: 81 (!) 56  (!) 59  Resp:  20  20  Temp:  98.4 F (36.9 C)  99.3 F (37.4 C)  TempSrc:  Oral  Oral  SpO2:  97%  95%  Weight:   74.4 kg   Height:        Intake/Output Summary (Last 24 hours) at 05/29/2024 1324 Last data filed  at 05/29/2024 0655 Gross per 24 hour  Intake 880.14 ml  Output 305 ml  Net 575.14 ml   Filed Weights   05/28/24 1708 05/29/24 0444  Weight: 66 kg 74.4 kg    Examination:  General exam: NAD Respiratory system: Clear to auscultation. Respiratory effort normal. Cardiovascular system: Swan S2, RRR, no murmurs, no pedal edema Gastrointestinal system: Soft, NT/ND, normal bowel sounds Central nervous  system: Alert and oriented. No focal neurological deficits. Extremities: Symmetric 5 x 5 power. Skin: No rashes, lesions or ulcers Psychiatry: Judgement and insight appear normal. Mood & affect appropriate.     Data Reviewed: I have personally reviewed following labs and imaging studies  CBC: Recent Labs  Lab 05/28/24 1711 05/29/24 0218  WBC 9.8 9.8  NEUTROABS 6.1  --   HGB 14.6 13.6  HCT 47.1 43.4  MCV 81.9 81.1  PLT 142* 148*   Basic Metabolic Panel: Recent Labs  Lab 05/28/24 1711 05/29/24 0218  NA 133* 139  K 5.1 5.2*  CL 107 112*  CO2 13* 18*  GLUCOSE 97 88  BUN 43* 39*  CREATININE 3.52* 3.09*  CALCIUM  9.2 8.7*   GFR: Estimated Creatinine Clearance: 14.9 mL/min (A) (by C-G formula based on SCr of 3.09 mg/dL (H)). Liver Function Tests: No results for input(s): AST, ALT, ALKPHOS, BILITOT, PROT, ALBUMIN in the last 168 hours. No results for input(s): LIPASE, AMYLASE in the last 168 hours. No results for input(s): AMMONIA in the last 168 hours. Coagulation Profile: No results for input(s): INR, PROTIME in the last 168 hours. Cardiac Enzymes: No results for input(s): CKTOTAL, CKMB, CKMBINDEX, TROPONINI in the last 168 hours. BNP (last 3 results) No results for input(s): PROBNP in the last 8760 hours. HbA1C: No results for input(s): HGBA1C in the last 72 hours. CBG: No results for input(s): GLUCAP in the last 168 hours. Lipid Profile: No results for input(s): CHOL, HDL, LDLCALC, TRIG, CHOLHDL, LDLDIRECT in the last 72 hours. Thyroid  Function Tests: No results for input(s): TSH, T4TOTAL, FREET4, T3FREE, THYROIDAB in the last 72 hours. Anemia Panel: No results for input(s): VITAMINB12, FOLATE, FERRITIN, TIBC, IRON, RETICCTPCT in the last 72 hours. Sepsis Labs: No results for input(s): PROCALCITON, LATICACIDVEN in the last 168 hours.  No results found for this or any previous visit (from  the past 240 hours).       Radiology Studies: US  Renal Result Date: 05/28/2024 CLINICAL DATA:  Acute kidney injury. EXAM: RENAL / URINARY TRACT ULTRASOUND COMPLETE COMPARISON:  Remote CT 11/21/2021 FINDINGS: Right Kidney: Renal measurements: 8.8 x 4.5 x 5.3 cm = volume: 109 mL. Thinning of the renal parenchyma with increased echogenicity. No hydronephrosis. No focal lesion or stone. Left Kidney: Renal measurements: 10.8 x 5.4 x 5.3 cm = volume: 161 mL. Thinning of the renal parenchyma with increased echogenicity. No hydronephrosis. No renal stone. 1.4 cm cyst in the upper pole. This needs no further imaging follow-up. Bladder: Partially distended with bladder volume of 143 cc. No bladder wall thickening. Postvoid residual of 59 cc. Other: Enlarged prostate measuring 5.2 x 4.3 x 4.6 cm causing mass effect on the bladder base. IMPRESSION: 1. No obstructive uropathy. 2. Thinning of both renal parenchyma with increased echogenicity typical of chronic medical renal disease. 3. Incomplete bladder emptying with postvoid residual of 59 cc, just under half the prevoid bladder volume. Electronically Signed   By: Andrea Gasman M.D.   On: 05/28/2024 20:46        Scheduled Meds:  allopurinol   100 mg  Oral Daily   amLODipine   10 mg Oral Daily   artificial tears  2 drop Both Eyes Daily   aspirin   81 mg Oral Daily   atorvastatin   80 mg Oral q1800   brimonidine   1 drop Both Eyes BID   calcium  carbonate  625 mg Oral Q breakfast   cholecalciferol   2,000 Units Oral Daily   clotrimazole   1 Application Topical BID   dicyclomine   10 mg Oral QID   heparin   5,000 Units Subcutaneous Q8H   hydrALAZINE   10 mg Oral BID   latanoprost   1 drop Both Eyes QHS   magnesium  oxide  400 mg Oral TID   metoprolol  tartrate  12.5 mg Oral BID   mirtazapine   15 mg Oral QHS   omega-3 acid ethyl esters  1 g Oral Daily   thiamine   100 mg Oral Daily   Continuous Infusions:  sodium chloride  75 mL/hr at 05/29/24 0655     LOS:  0 days    Calvin KATHEE Robson, MD [facility]   If 7PM-7AM, please contact night-coverage  05/29/2024, 1:24 PM

## 2024-05-29 NOTE — Care Management Obs Status (Signed)
 MEDICARE OBSERVATION STATUS NOTIFICATION   Patient Details  Name: Frank Jefferson MRN: [id number] Date of Birth: 05-13-1931   Medicare Observation Status Notification Given:  Other (see comment) (spoke w/Brenda Barrio verbally, she didn't want a copy)    Frank Jefferson 05/29/2024, 12:17 PM

## 2024-05-29 NOTE — Plan of Care (Signed)

## 2024-05-30 ENCOUNTER — Inpatient Hospital Stay

## 2024-05-30 DIAGNOSIS — N179 Acute kidney failure, unspecified: Secondary | ICD-10-CM | POA: Diagnosis not present

## 2024-05-30 DIAGNOSIS — N1832 Chronic kidney disease, stage 3b: Secondary | ICD-10-CM | POA: Diagnosis not present

## 2024-05-30 LAB — BASIC METABOLIC PANEL WITH GFR
Anion gap: 10 (ref 5–15)
BUN: 35 mg/dL — ABNORMAL HIGH (ref 8–23)
CO2: 16 mmol/L — ABNORMAL LOW (ref 22–32)
Calcium: 8.6 mg/dL — ABNORMAL LOW (ref 8.9–10.3)
Chloride: 116 mmol/L — ABNORMAL HIGH (ref 98–111)
Creatinine, Ser: 2.48 mg/dL — ABNORMAL HIGH (ref 0.61–1.24)
GFR, Estimated: 24 mL/min — ABNORMAL LOW (ref >60)
Glucose, Bld: 84 mg/dL (ref 70–99)
Potassium: 5.4 mmol/L — ABNORMAL HIGH (ref 3.5–5.1)
Sodium: 142 mmol/L (ref 135–145)

## 2024-05-30 LAB — MAGNESIUM: Magnesium: 1.9 mg/dL (ref 1.7–2.4)

## 2024-05-30 MED ORDER — LACTATED RINGERS IV SOLN: INTRAVENOUS | Status: DC

## 2024-05-30 MED ORDER — HYDROCOD POLI-CHLORPHE POLI ER 10-8 MG/5ML PO SUER
5.0000 mL | Freq: Every evening | ORAL | Status: DC | PRN
  Administered 2024-05-30: 5 mL via ORAL
  Filled 2024-05-30: qty 5

## 2024-05-30 MED ORDER — BENZONATATE 100 MG PO CAPS
200.0000 mg | ORAL_CAPSULE | Freq: Three times a day (TID) | ORAL | Status: DC
  Administered 2024-05-30 – 2024-05-31 (×2): 200 mg via ORAL
  Filled 2024-05-30 (×2): qty 2

## 2024-05-30 NOTE — Plan of Care (Signed)

## 2024-05-30 NOTE — Evaluation (Signed)
 Occupational Therapy Evaluation Patient Details Name: Frank Jefferson MRN: [id number] DOB: [birth year] Today's Date: 05/30/2024   History of Present Illness   Pt is a 89 y.o. male with medical history significant of CKD-3B, HTN, HLD, Dm, PVD, CAD and STEMI (04/02/24), s/p of DES, dCHF, hypothyroidism, dementia, IBS, carotid artery stenosis, alcohol  use in remission, Korsakoff syndrome, and thrombocytopenia who presented to the ED with abnormal lab (increased creatinine). MD assessment includes AKI.     Clinical Impressions MR. Sox was seen for OT evaluation this date. Prior to hospital admission, pt was modified independent in all aspects of ADL/IADL, using a SPC for functional mobility, and denies falls history in past 12 months. Pt lives at an assisted living facility with staff available to assist PRN. Pt demonstrates baseline independence to perform ADL and mobility tasks. Pt denies functional deficits at this time. He is up in room without AD or using his IV pole for support if needed. Educated on safety and falls prevention for home and hospital. No further skilled OT needs identified. Will sign off. Please re-consult if additional OT needs arise.      If plan is discharge home, recommend the following:   Assist for transportation     Functional Status Assessment   Patient has not had a recent decline in their functional status     Equipment Recommendations   None recommended by OT     Recommendations for Other Services         Precautions/Restrictions   Precautions Precautions: Fall Recall of Precautions/Restrictions: Intact Precaution/Restrictions Comments: Moderate Fall Restrictions Weight Bearing Restrictions Per Provider Order: No     Mobility Bed Mobility Overal bed mobility: Independent             General bed mobility comments: Pt independent with all bed mobility tasks without requiring physical assistance or use of bedrails.     Transfers Overall transfer level: Independent Equipment used: None Transfers: Sit to/from Stand Sit to Stand: Modified independent (Device/Increase time)           General transfer comment: Increased time/effort to perform, but able to transfer from STS without assist, good safety awareness and control t/o session.      Balance Overall balance assessment: No apparent balance deficits (not formally assessed)                                         ADL either performed or assessed with clinical judgement   ADL Overall ADL's : At baseline                                       General ADL Comments: Pt up without AD in room, able to stand at sink for grooming, amb to/from bathroom without AD. Reports feeling at or near baseline level of functional independence for all ADL management. ALF staff available to provide assist if needed upon DC.     Vision Patient Visual Report: No change from baseline       Perception         Praxis         Pertinent Vitals/Pain Pain Assessment Pain Assessment: No/denies pain     Extremity/Trunk Assessment Upper Extremity Assessment Upper Extremity Assessment: Overall WFL for tasks assessed   Lower Extremity Assessment Lower Extremity Assessment: Overall  WFL for tasks assessed   Cervical / Trunk Assessment Cervical / Trunk Assessment:  (Mild fwd head/shoulders)   Communication Communication Communication: Impaired Factors Affecting Communication: Hearing impaired   Cognition Arousal: Alert Behavior During Therapy: WFL for tasks assessed/performed                                 Following commands: Intact       Cueing  General Comments   Cueing Techniques: Verbal cues;Gestural cues      Exercises Other Exercises Other Exercises: Pt educated on role of OT in acute setting and falls prevention strategies for home and hospital. Return verbalizes understanding of education  provided.   Shoulder Instructions      Home Living Family/patient expects to be discharged to:: Assisted living                             Home Equipment: [employer] - single point          Prior Functioning/Environment Prior Level of Function : Independent/Modified Independent             Mobility Comments: Pt reported being modI for facility ambulation with SPC. Pt reported 0 falls in the past 6 months. ADLs Comments: Pt reported being independent with ADLs without AD use. Facility provides meals.    OT Problem List: Decreased strength;Decreased safety awareness;Decreased knowledge of use of DME or AE   OT Treatment/Interventions:        OT Goals(Current goals can be found in the care plan section)   Acute Rehab OT Goals Patient Stated Goal: to go home OT Goal Formulation: All assessment and education complete, DC therapy Time For Goal Achievement: 05/30/24 Potential to Achieve Goals: Good   OT Frequency:       Co-evaluation              AM-PAC OT 6 Clicks Daily Activity     Outcome Measure Help from another person eating meals?: None Help from another person taking care of personal grooming?: None Help from another person toileting, which includes using toliet, bedpan, or urinal?: None Help from another person bathing (including washing, rinsing, drying)?: None Help from another person to put on and taking off regular upper body clothing?: None Help from another person to put on and taking off regular lower body clothing?: None 6 Click Score: 24   End of Session Equipment Utilized During Treatment: Gait belt;Rolling walker (2 wheels)  Activity Tolerance: Patient tolerated treatment well Patient left: in bed;with call bell/phone within reach;with bed alarm set  OT Visit Diagnosis: Other abnormalities of gait and mobility (R26.89)                Time: 8671-8659 OT Time Calculation (min): 12 min Charges:  OT General Charges $OT Visit: 1  Visit OT Evaluation $OT Eval Low Complexity: 1 Low  Frank Jefferson, M.S., OTR/L 05/30/24, 2:07 PM

## 2024-05-30 NOTE — Evaluation (Addendum)
 Physical Therapy Evaluation Patient Details Name: Frank Jefferson MRN: [id number] DOB: 1930-12-17 Today's Date: 05/30/2024  History of Present Illness  Pt is a 89 y.o. male with medical history significant of CKD-3B, HTN, HLD, Dm, PVD, CAD and STEMI (04/02/24), s/p of DES, dCHF, hypothyroidism, dementia, IBS, carotid artery stenosis, alcohol  use in remission, Korsakoff syndrome, and thrombocytopenia who presented to the ED with abnormal lab (increased creatinine). MD assessment includes AKI.  Clinical Impression  Pt was pleasant and motivated to participate during the session and put forth good effort throughout. Pt was able to complete STS from EOB and standard height toilet with supervision and no AD. Pt ambulated with supervision and no AD as well, and remained steady throughout and no LOBs occurred. Pt did not demonstrate any safety concerns and stated that he feels that he has returned to baseline level of function. Pt reported no adverse symptoms during the session with SpO2 and HR WNL throughout on rom air. Will complete PT orders at this time but will reassess pt pending a change in status upon receipt of new PT orders.       If plan is discharge home, recommend the following: Assist for transportation   Can travel by private vehicle        Equipment Recommendations None recommended by PT  Recommendations for Other Services       Functional Status Assessment Patient has not had a recent decline in their functional status     Precautions / Restrictions Precautions Precautions: None Restrictions Weight Bearing Restrictions Per Provider Order: No      Mobility  Bed Mobility Overal bed mobility: Independent             General bed mobility comments: Pt independent with all bed mobility tasks without requiring physical assistance or use of bedrails.    Transfers Overall transfer level: Needs assistance Equipment used: None Transfers: Sit to/from Stand Sit to Stand:  Supervision           General transfer comment: Pt required supervision for STS from EOB without AD, but remained steady throughout    Ambulation/Gait Ambulation/Gait assistance: Supervision Gait Distance (Feet): 125 Feet Assistive device: None Gait Pattern/deviations: Step-through pattern, Decreased step length - right, Decreased step length - left       General Gait Details: Pt demonstrated decreased step length bilaterally without AD use, but remained steady throughout and no LOBs occured  Stairs            Wheelchair Mobility     Tilt Bed    Modified Rankin (Stroke Patients Only)       Balance Overall balance assessment: Needs assistance Sitting-balance support: Feet supported, No upper extremity supported Sitting balance-Leahy Scale: Normal     Standing balance support: No upper extremity supported, During functional activity Standing balance-Leahy Scale: Good                               Pertinent Vitals/Pain Pain Assessment Pain Assessment: No/denies pain    Home Living Family/patient expects to be discharged to:: Assisted living                 Home Equipment: [employer] - single point      Prior Function Prior Level of Function : Independent/Modified Independent             Mobility Comments: Pt reported being modI for facility ambulation with SPC. Pt reported 0 falls in  the past 6 months. ADLs Comments: Pt reported being independent with ADLs without AD use.     Extremity/Trunk Assessment   Upper Extremity Assessment Upper Extremity Assessment: Defer to OT evaluation    Lower Extremity Assessment Lower Extremity Assessment: Overall WFL for tasks assessed       Communication   Communication Communication: Impaired Factors Affecting Communication: Hearing impaired    Cognition Arousal: Alert Behavior During Therapy: WFL for tasks assessed/performed   PT - Cognitive impairments: No apparent impairments                          Following commands: Intact       Cueing Cueing Techniques: Verbal cues, Gestural cues     General Comments      Exercises     Assessment/Plan    PT Assessment Patient does not need any further PT services  PT Problem List         PT Treatment Interventions      PT Goals (Current goals can be found in the Care Plan section)  Acute Rehab PT Goals Patient Stated Goal: to be able to ambulate increased distances without fatigue PT Goal Formulation: With patient Time For Goal Achievement: 06/12/24 Potential to Achieve Goals: Good    Frequency       Co-evaluation               AM-PAC PT 6 Clicks Mobility  Outcome Measure Help needed turning from your back to your side while in a flat bed without using bedrails?: None Help needed moving from lying on your back to sitting on the side of a flat bed without using bedrails?: None Help needed moving to and from a bed to a chair (including a wheelchair)?: None Help needed standing up from a chair using your arms (e.g., wheelchair or bedside chair)?: None Help needed to walk in hospital room?: None Help needed climbing 3-5 steps with a railing? : A Little 6 Click Score: 23    End of Session Equipment Utilized During Treatment: Gait belt Activity Tolerance: Patient tolerated treatment well Patient left: in bed;with call bell/phone within reach;with bed alarm set Nurse Communication: Mobility status PT Visit Diagnosis: Muscle weakness (generalized) (M62.81)ud    Time: 8867-8847 PT Time Calculation (min) (ACUTE ONLY): 20 min   Charges:                 Leontine Ingles, SPT 05/30/24, 12:53 PM This entire session was performed under direct supervision and direction of a licensed therapist/therapist assistant. I have personally read, edited and approve of the note as written.  Carmin Deed, DPT

## 2024-05-30 NOTE — Progress Notes (Signed)
 PROGRESS NOTE    Frank Jefferson  FMW:[id number] DOB: September 14, 1931 DOA: 05/28/2024 PCP: [facility]    Brief Narrative:  89 y.o. male with medical history significant of CKD-3B, HTN, HLD, Dm, PVD, CAD and STEMI (04/02/24), s/p of DES, dCHF, hypothyroidism, dementia, IBS, carotid artery stenosis, alcohol  use in remission, Korsakoff syndrome, thrombocytopenia, who presents with abnormal lab.   Per patient's sister -in law and brother at the bedside, patient had lab work done in ALF, and found that he has worsening renal function.  Patient is sent to ED for further evaluation and treatment.  Patient is asymptomatic, denies symptoms of UTI, no nausea, vomiting, diarrhea or abdominal pain.  No chest pain, cough, SOB.     Of note, pt had SPEP at 12/20/21 by Dr. Tina of [facility] nephrology, which showed a slight irregularity in the gamma region, which may represent monoclonal protein, but immunofixation Electrophoresis of Serum showed no monoclonal protein detected.   Assessment & Plan:   Principal Problem:   Acute kidney injury superimposed on stage 3b chronic kidney disease (HCC) Active Problems:   HTN (hypertension)   CAD (coronary artery disease)   Chronic diastolic CHF (congestive heart failure) (HCC)   Hyperlipidemia   Type II diabetes mellitus with renal manifestations (HCC)   Thrombocytopenia (HCC)   AKI (acute kidney injury) (HCC)  Acute kidney injury superimposed on stage 3b chronic kidney disease (HCC) None anion gap metabolic acidosis: Etiology is not clear.  No urinary retention or obstruction by renal ultrasound. Pt recently took Bactrim which has at least partially contributed.  UA negative, will hold off antibiotics.  Fractional secretion of sodium 5.87%.  I suspect ATN secondary to Bactrim use. Kidney function improving Plan: Continue IVF, NS at 75 cc/h Daily BMP Hold off on nephrology consult for today.   If creatinine worsens will engage nephrology 7/25   HTN  (hypertension):  - IV hydralazine  as needed - Amlodipine , metoprolol    CAD (coronary artery disease):  S/p of stent placement.  No chest pain -Aspirin , Brilinta , Lipitor , metoprolol    Chronic diastolic CHF (congestive heart failure) (HCC):  2D echo on 01/17/2022 showed EF> 55% with grade 1 diastolic dysfunction.  Patient does not have leg edema or JVD.  CHF is compensated. - Monitor closely while on IVF   Hyperlipidemia -Lipitor    Diet controlled type II diabetes mellitus with renal manifestations [facility]): Recent A1c 5.4, well-controlled.  Patient not taking medications.  Blood sugar 97 by BMP - No treatment needed.   Thrombocytopenia (HCC): This is chronic issue.  Platelets are 142, no active bleeding. -Follow-up with CBC   DVT prophylaxis: SQH Code Status: Full Family Communication: Julio America [phone] on 7/23 Disposition Plan: Status is: Inpatient Remains inpatient appropriate because: AKI.  Suspect ATN secondary to Bactrim use.  Creatinine improving.  Anticipate discharge 7/25.     Level of care: Med-Surg  Consultants:  None  Procedures:  None  Antimicrobials: None   Subjective: Seen and examined.  Sitting up on edge of bed.  Appears well.  No complaints of pain.  Objective: Vitals:   05/29/24 2036 05/30/24 0407 05/30/24 0420 05/30/24 0822  BP: (!) 119/59 127/63  122/66  Pulse: (!) 57 70  65  Resp: 20 16  18   Temp: 99.1 F (37.3 C) 98.7 F (37.1 C)  99 F (37.2 C)  TempSrc: Oral Oral  Oral  SpO2: 96% 96%  97%  Weight:   74 kg   Height:  Intake/Output Summary (Last 24 hours) at 05/30/2024 1225 Last data filed at 05/30/2024 0900 Gross per 24 hour  Intake 435 ml  Output 100 ml  Net 335 ml   Filed Weights   05/28/24 1708 05/29/24 0444 05/30/24 0420  Weight: 66 kg 74.4 kg 74 kg    Examination:  General exam: No acute distress.  Frail-appearing Respiratory system: Clear to auscultation. Respiratory effort  normal. Cardiovascular system: S1-S2, RRR, no murmurs, no pedal edema Gastrointestinal system: Soft, NT/ND, normal bowel sounds Central nervous system: Alert and oriented. No focal neurological deficits. Extremities: Symmetric 5 x 5 power. Skin: No rashes, lesions or ulcers Psychiatry: Judgement and insight appear normal. Mood & affect appropriate.     Data Reviewed: I have personally reviewed following labs and imaging studies  CBC: Recent Labs  Lab 05/28/24 1711 05/29/24 0218  WBC 9.8 9.8  NEUTROABS 6.1  --   HGB 14.6 13.6  HCT 47.1 43.4  MCV 81.9 81.1  PLT 142* 148*   Basic Metabolic Panel: Recent Labs  Lab 05/28/24 1711 05/29/24 0218 05/30/24 0656  NA 133* 139 142  K 5.1 5.2* 5.4*  CL 107 112* 116*  CO2 13* 18* 16*  GLUCOSE 97 88 84  BUN 43* 39* 35*  CREATININE 3.52* 3.09* 2.48*  CALCIUM  9.2 8.7* 8.6*  MG  --   --  1.9   GFR: Estimated Creatinine Clearance: 18.5 mL/min (A) (by C-G formula based on SCr of 2.48 mg/dL (H)). Liver Function Tests: No results for input(s): AST, ALT, ALKPHOS, BILITOT, PROT, ALBUMIN in the last 168 hours. No results for input(s): LIPASE, AMYLASE in the last 168 hours. No results for input(s): AMMONIA in the last 168 hours. Coagulation Profile: No results for input(s): INR, PROTIME in the last 168 hours. Cardiac Enzymes: No results for input(s): CKTOTAL, CKMB, CKMBINDEX, TROPONINI in the last 168 hours. BNP (last 3 results) No results for input(s): PROBNP in the last 8760 hours. HbA1C: No results for input(s): HGBA1C in the last 72 hours. CBG: No results for input(s): GLUCAP in the last 168 hours. Lipid Profile: No results for input(s): CHOL, HDL, LDLCALC, TRIG, CHOLHDL, LDLDIRECT in the last 72 hours. Thyroid  Function Tests: No results for input(s): TSH, T4TOTAL, FREET4, T3FREE, THYROIDAB in the last 72 hours. Anemia Panel: No results for input(s): VITAMINB12,  FOLATE, FERRITIN, TIBC, IRON, RETICCTPCT in the last 72 hours. Sepsis Labs: No results for input(s): PROCALCITON, LATICACIDVEN in the last 168 hours.  No results found for this or any previous visit (from the past 240 hours).       Radiology Studies: US  Renal Result Date: 05/28/2024 CLINICAL DATA:  Acute kidney injury. EXAM: RENAL / URINARY TRACT ULTRASOUND COMPLETE COMPARISON:  Remote CT 11/21/2021 FINDINGS: Right Kidney: Renal measurements: 8.8 x 4.5 x 5.3 cm = volume: 109 mL. Thinning of the renal parenchyma with increased echogenicity. No hydronephrosis. No focal lesion or stone. Left Kidney: Renal measurements: 10.8 x 5.4 x 5.3 cm = volume: 161 mL. Thinning of the renal parenchyma with increased echogenicity. No hydronephrosis. No renal stone. 1.4 cm cyst in the upper pole. This needs no further imaging follow-up. Bladder: Partially distended with bladder volume of 143 cc. No bladder wall thickening. Postvoid residual of 59 cc. Other: Enlarged prostate measuring 5.2 x 4.3 x 4.6 cm causing mass effect on the bladder base. IMPRESSION: 1. No obstructive uropathy. 2. Thinning of both renal parenchyma with increased echogenicity typical of chronic medical renal disease. 3. Incomplete bladder emptying with postvoid residual of  59 cc, just under half the prevoid bladder volume. Electronically Signed   By: Andrea Gasman M.D.   On: 05/28/2024 20:46        Scheduled Meds:  allopurinol   100 mg Oral Daily   amLODipine   10 mg Oral Daily   artificial tears  2 drop Both Eyes Daily   aspirin   81 mg Oral Daily   atorvastatin   80 mg Oral q1800   brimonidine   1 drop Both Eyes BID   calcium  carbonate  625 mg Oral Q breakfast   cholecalciferol   2,000 Units Oral Daily   clotrimazole   1 Application Topical BID   dicyclomine   10 mg Oral QID   heparin   5,000 Units Subcutaneous Q8H   hydrALAZINE   10 mg Oral BID   latanoprost   1 drop Both Eyes QHS   magnesium  oxide  400 mg Oral TID    metoprolol  tartrate  12.5 mg Oral BID   mirtazapine   15 mg Oral QHS   omega-3 acid ethyl esters  1 g Oral Daily   thiamine   100 mg Oral Daily   Continuous Infusions:  lactated ringers  75 mL/hr at 05/30/24 1000     LOS: 1 day    Calvin KATHEE Robson, MD [facility]   If 7PM-7AM, please contact night-coverage  05/30/2024, 12:25 PM

## 2024-05-30 NOTE — Care Management Important Message (Signed)
 Important Message  Patient Details  Name: TRACI PLEMONS MRN: [id number] Date of Birth: 05-18-1931   Important Message Given:  Yes - Medicare IM     Rojelio SHAUNNA Rattler 05/30/2024, 1:04 PM

## 2024-05-31 DIAGNOSIS — N1832 Chronic kidney disease, stage 3b: Secondary | ICD-10-CM | POA: Diagnosis not present

## 2024-05-31 DIAGNOSIS — N179 Acute kidney failure, unspecified: Secondary | ICD-10-CM | POA: Diagnosis not present

## 2024-05-31 LAB — BASIC METABOLIC PANEL WITH GFR
Anion gap: 8 (ref 5–15)
BUN: 27 mg/dL — ABNORMAL HIGH (ref 8–23)
CO2: 18 mmol/L — ABNORMAL LOW (ref 22–32)
Calcium: 8.4 mg/dL — ABNORMAL LOW (ref 8.9–10.3)
Chloride: 114 mmol/L — ABNORMAL HIGH (ref 98–111)
Creatinine, Ser: 2.14 mg/dL — ABNORMAL HIGH (ref 0.61–1.24)
GFR, Estimated: 29 mL/min — ABNORMAL LOW (ref >60)
Glucose, Bld: 84 mg/dL (ref 70–99)
Potassium: 5.1 mmol/L (ref 3.5–5.1)
Sodium: 140 mmol/L (ref 135–145)

## 2024-05-31 LAB — CBC
HCT: 41.6 % (ref 39.0–52.0)
Hemoglobin: 13.1 g/dL (ref 13.0–17.0)
MCH: 25.9 pg — ABNORMAL LOW (ref 26.0–34.0)
MCHC: 31.5 g/dL (ref 30.0–36.0)
MCV: 82.2 fL (ref 80.0–100.0)
Platelets: 135 K/uL — ABNORMAL LOW (ref 150–400)
RBC: 5.06 MIL/uL (ref 4.22–5.81)
RDW: 15.7 % — ABNORMAL HIGH (ref 11.5–15.5)
WBC: 11.5 K/uL — ABNORMAL HIGH (ref 4.0–10.5)
nRBC: 0 % (ref 0.0–0.2)

## 2024-05-31 NOTE — Discharge Summary (Signed)
 Physician Discharge Summary  Frank Jefferson FMW:[id number] DOB: 11-28-1930 DOA: 05/28/2024  PCP: [facility]  Admit date: 05/28/2024 Discharge date: 05/31/2024  Admitted From: ALF Disposition:  ALF  Recommendations for Outpatient Follow-up:  Follow up with PCP in 1-2 weeks Repeat lab work with kidney function in 1 week  Home Health:No  Equipment/Devices:None   Discharge Condition:Stable  CODE STATUS:FULL  Diet recommendation: Reg  Brief/Interim Summary:  89 y.o. male with medical history significant of CKD-3B, HTN, HLD, Dm, PVD, CAD and STEMI (04/02/24), s/p of DES, dCHF, hypothyroidism, dementia, IBS, carotid artery stenosis, alcohol  use in remission, Korsakoff syndrome, thrombocytopenia, who presents with abnormal lab.   Per patient's sister -in law and brother at the bedside, patient had lab work done in ALF, and found that he has worsening renal function.  Patient is sent to ED for further evaluation and treatment.  Patient is asymptomatic, denies symptoms of UTI, no nausea, vomiting, diarrhea or abdominal pain.  No chest pain, cough, SOB.     Of note, pt had SPEP at 12/20/21 by Dr. Tina of [facility] nephrology, which showed a slight irregularity in the gamma region, which may represent monoclonal protein, but immunofixation Electrophoresis of Serum showed no monoclonal protein detected.      Discharge Diagnoses:  Principal Problem:   Acute kidney injury superimposed on stage 3b chronic kidney disease (HCC) Active Problems:   HTN (hypertension)   CAD (coronary artery disease)   Chronic diastolic CHF (congestive heart failure) (HCC)   Hyperlipidemia   Type II diabetes mellitus with renal manifestations (HCC)   Thrombocytopenia (HCC)   AKI (acute kidney injury) (HCC)   Acute kidney injury superimposed on stage 3b chronic kidney disease (HCC) None anion gap metabolic acidosis: Etiology is not clear.  No urinary retention or obstruction by renal ultrasound. Pt  recently took Bactrim which has at least partially contributed.  UA negative, will hold off antibiotics.  Fractional secretion of sodium 5.87%.  I suspect ATN secondary to Bactrim use. Kidney function improving 2.14 creat at discharge Plan: Discharge back to ALF Follow up PCP 1 week REPEAT KIDNEY FUNCTION/BMP in 1 WEEK Held bactrim on dc Can consider outpatient referral to nephrology   Discharge Instructions  Discharge Instructions     Diet - low sodium heart healthy   Complete by: As directed    Increase activity slowly   Complete by: As directed    No wound care   Complete by: As directed       Allergies as of 05/31/2024       Reactions   Ace Inhibitors Anaphylaxis, Swelling   Ciprofloxacin Anaphylaxis   Penicillins Anaphylaxis        Medication List     STOP taking these medications    alum & mag hydroxide-simeth 200-200-20 MG/5ML suspension Commonly known as: MAALOX/MYLANTA   Bactrim DS 800-160 MG tablet Generic drug: sulfamethoxazole-trimethoprim    bismuth subsalicylate 262 MG/15ML suspension Commonly known as: PEPTO BISMOL   doxycycline 100 MG capsule Commonly known as: VIBRAMYCIN   folic acid  1 MG tablet Commonly known as: FOLVITE    loperamide  2 MG capsule Commonly known as: IMODIUM    LORazepam  0.5 MG tablet Commonly known as: ATIVAN    magnesium  hydroxide 400 MG/5ML suspension Commonly known as: MILK OF MAGNESIA   magnesium  oxide 400 MG tablet Commonly known as: MAG-OX   psyllium 58.6 % powder Commonly known as: METAMUCIL   ROBITUSSIN CF PO   sodium phosphate 7-19 GM/118ML Enem   ticagrelor  90 MG Tabs  tablet Commonly known as: BRILINTA        TAKE these medications    acetaminophen  325 MG tablet Commonly known as: TYLENOL  Take 650 mg by mouth every 4 (four) hours as needed for fever.   allopurinol  100 MG tablet Commonly known as: ZYLOPRIM  Take 100 mg by mouth daily.   amLODipine  5 MG tablet Commonly known as: NORVASC  Take  1 tablet (5 mg total) by mouth daily. What changed: how much to take   aspirin  81 MG chewable tablet Chew 1 tablet (81 mg total) by mouth daily.   atorvastatin  80 MG tablet Commonly known as: LIPITOR  Take 1 tablet (80 mg total) by mouth daily at 6 PM.   BAZA PROTECT EX Apply 1 application topically 2 (two) times daily as needed (skin protection).   brimonidine  0.2 % ophthalmic solution Commonly known as: ALPHAGAN  Place 1 drop into both eyes 2 (two) times daily.   calcium  carbonate 1500 (600 Ca) MG Tabs tablet Commonly known as: OSCAL Take 600 mg by mouth daily.   Cholecalciferol  50 MCG (2000 UT) Tabs Take 2,000 Units by mouth daily.   Clotrimazole  Anti-Fungal 1 % cream Generic drug: clotrimazole  Apply 1 Application topically 2 (two) times daily.   dicyclomine  10 MG capsule Commonly known as: BENTYL  Take 10 mg by mouth 4 (four) times daily.   Fish Oil 1000 MG Caps Take 2,000 mg by mouth 2 (two) times daily.   HONEY LEMON COUGH DROPS MT Use as directed 1 tablet in the mouth or throat every 3 (three) hours as needed (cough).   hydrALAZINE  10 MG tablet Commonly known as: APRESOLINE  Take 10 mg by mouth 2 (two) times daily.   Lumigan 0.01 % Soln Generic drug: bimatoprost Place 1 drop into both eyes at bedtime.   magnesium  oxide 400 (240 Mg) MG tablet Commonly known as: MAG-OX Take 400 mg by mouth 3 (three) times daily.   metoprolol  tartrate 25 MG tablet Commonly known as: LOPRESSOR  Take 1 tablet (25 mg total) by mouth 2 (two) times daily. What changed: how much to take   mirtazapine  15 MG tablet Commonly known as: REMERON  Take 15 mg by mouth at bedtime.   ondansetron  4 MG disintegrating tablet Commonly known as: ZOFRAN -ODT Take 1 tablet (4 mg total) by mouth every 6 (six) hours as needed for nausea or vomiting.   Refresh Liquigel 1 % Gel Generic drug: Carboxymethylcellulose Sodium Apply 2 drops to eye daily.   simethicone  80 MG chewable tablet Commonly  known as: MYLICON Chew 80 mg by mouth every 6 (six) hours as needed for flatulence.   thiamine  100 MG tablet Commonly known as: Vitamin B-1 Take 100 mg by mouth daily.   traMADol  50 MG tablet Commonly known as: ULTRAM  Take 50 mg by mouth every 12 (twelve) hours as needed for moderate pain (pain score 4-6).        Follow-up Information     [facility] Schedule an appointment as soon as possible for a visit in 1 week(s).   Specialty: Geriatric Medicine Why: Repeat kidney function in 1 week Contact information: [address] [place] [address] [phone]                Allergies  Allergen Reactions   Ace Inhibitors Anaphylaxis and Swelling   Ciprofloxacin Anaphylaxis   Penicillins Anaphylaxis    Consultations: None   Procedures/Studies: DG Chest Port 1 View Result Date: 05/30/2024 EXAM: 1 VIEW XRAY OF THE CHEST 05/30/2024 06:55:55 PM COMPARISON:  04/03/2019 CLINICAL HISTORY: Shortness of breath 10026. SOB FINDINGS: LUNGS AND PLEURA: No focal pulmonary opacity. No pulmonary edema. No pleural effusion. No pneumothorax. HEART AND MEDIASTINUM: No acute abnormality of the cardiac and mediastinal silhouettes. Aortic atherosclerosis. BONES AND SOFT TISSUES: Old fracture deformity of distal left clavicle. No acute osseous abnormality. IMPRESSION: 1. No acute process. Electronically signed by: Pinkie Pebbles MD 05/30/2024 07:57 PM EDT RP Workstation: [id number]   US  Renal Result Date: 05/28/2024 CLINICAL DATA:  Acute kidney injury. EXAM: RENAL / URINARY TRACT ULTRASOUND COMPLETE COMPARISON:  Remote CT 11/21/2021 FINDINGS: Right Kidney: Renal measurements: 8.8 x 4.5 x 5.3 cm = volume: 109 mL. Thinning of the renal parenchyma with increased echogenicity. No hydronephrosis. No focal lesion or stone. Left Kidney: Renal measurements: 10.8 x 5.4 x 5.3 cm = volume: 161 mL. Thinning of the renal parenchyma with increased echogenicity. No hydronephrosis.  No renal stone. 1.4 cm cyst in the upper pole. This needs no further imaging follow-up. Bladder: Partially distended with bladder volume of 143 cc. No bladder wall thickening. Postvoid residual of 59 cc. Other: Enlarged prostate measuring 5.2 x 4.3 x 4.6 cm causing mass effect on the bladder base. IMPRESSION: 1. No obstructive uropathy. 2. Thinning of both renal parenchyma with increased echogenicity typical of chronic medical renal disease. 3. Incomplete bladder emptying with postvoid residual of 59 cc, just under half the prevoid bladder volume. Electronically Signed   By: Andrea Gasman M.D.   On: 05/28/2024 20:46      Subjective: Seen and examined on day of discharge Appropriate for return to ALF  Discharge Exam: Vitals:   05/30/24 2012 05/31/24 0316  BP: (!) 156/81 134/62  Pulse: 74 65  Resp: 20 20  Temp: 98.9 F (37.2 C) 98.2 F (36.8 C)  SpO2: 96% 98%   Vitals:   05/30/24 1429 05/30/24 2012 05/31/24 0316 05/31/24 0414  BP: (!) 103/45 (!) 156/81 134/62   Pulse: (!) 57 74 65   Resp: 18 20 20    Temp: 97.8 F (36.6 C) 98.9 F (37.2 C) 98.2 F (36.8 C)   TempSrc: Oral Oral Oral   SpO2: 98% 96% 98%   Weight:    73.9 kg  Height:        General: Pt is alert, awake, not in acute distress Cardiovascular: RRR, S1/S2 +, no rubs, no gallops Respiratory: CTA bilaterally, no wheezing, no rhonchi Abdominal: Soft, NT, ND, bowel sounds + Extremities: no edema, no cyanosis    The results of significant diagnostics from this hospitalization (including imaging, microbiology, ancillary and laboratory) are listed below for reference.     Microbiology: No results found for this or any previous visit (from the past 240 hours).   Labs: BNP (last 3 results) Recent Labs    05/28/24 1711  BNP 183.9*   Basic Metabolic Panel: Recent Labs  Lab 05/28/24 1711 05/29/24 0218 05/30/24 0656 05/31/24 0338  NA 133* 139 142 140  K 5.1 5.2* 5.4* 5.1  CL 107 112* 116* 114*  CO2 13*  18* 16* 18*  GLUCOSE 97 88 84 84  BUN 43* 39* 35* 27*  CREATININE 3.52* 3.09* 2.48* 2.14*  CALCIUM  9.2 8.7* 8.6* 8.4*  MG  --   --  1.9  --    Liver Function Tests: No results for input(s): AST, ALT, ALKPHOS, BILITOT, PROT, ALBUMIN in the last 168 hours. No results for input(s): LIPASE, AMYLASE in the last 168 hours. No results for input(s): AMMONIA in the last 168 hours. CBC: Recent  Labs  Lab 05/28/24 1711 05/29/24 0218 05/31/24 0338  WBC 9.8 9.8 11.5*  NEUTROABS 6.1  --   --   HGB 14.6 13.6 13.1  HCT 47.1 43.4 41.6  MCV 81.9 81.1 82.2  PLT 142* 148* 135*   Cardiac Enzymes: No results for input(s): CKTOTAL, CKMB, CKMBINDEX, TROPONINI in the last 168 hours. BNP: Invalid input(s): POCBNP CBG: No results for input(s): GLUCAP in the last 168 hours. D-Dimer No results for input(s): DDIMER in the last 72 hours. Hgb A1c No results for input(s): HGBA1C in the last 72 hours. Lipid Profile No results for input(s): CHOL, HDL, LDLCALC, TRIG, CHOLHDL, LDLDIRECT in the last 72 hours. Thyroid  function studies No results for input(s): TSH, T4TOTAL, T3FREE, THYROIDAB in the last 72 hours.  Invalid input(s): FREET3 Anemia work up No results for input(s): VITAMINB12, FOLATE, FERRITIN, TIBC, IRON, RETICCTPCT in the last 72 hours. Urinalysis    Component Value Date/Time   COLORURINE STRAW (A) 05/28/2024 2145   APPEARANCEUR CLEAR (A) 05/28/2024 2145   APPEARANCEUR Cloudy (A) 02/26/2018 1448   LABSPEC 1.008 05/28/2024 2145   LABSPEC 1.012 04/23/2014 1606   PHURINE 7.0 05/28/2024 2145   GLUCOSEU NEGATIVE 05/28/2024 2145   GLUCOSEU Negative 04/23/2014 1606   HGBUR NEGATIVE 05/28/2024 2145   BILIRUBINUR NEGATIVE 05/28/2024 2145   BILIRUBINUR Negative 02/26/2018 1448   BILIRUBINUR Negative 04/23/2014 1606   KETONESUR NEGATIVE 05/28/2024 2145   PROTEINUR NEGATIVE 05/28/2024 2145   NITRITE NEGATIVE 05/28/2024 2145    LEUKOCYTESUR NEGATIVE 05/28/2024 2145   LEUKOCYTESUR Negative 04/23/2014 1606   Sepsis Labs Recent Labs  Lab 05/28/24 1711 05/29/24 0218 05/31/24 0338  WBC 9.8 9.8 11.5*   Microbiology No results found for this or any previous visit (from the past 240 hours).   Time coordinating discharge: 40 minutes   SIGNED:   Calvin KATHEE Robson, MD  [facility] 05/31/2024, 9:09 AM Pager   If 7PM-7AM, please contact night-coverage

## 2024-05-31 NOTE — Plan of Care (Signed)

## 2024-05-31 NOTE — [place] FL2 (Signed)
   MEDICAID FL2 LEVEL OF CARE FORM     IDENTIFICATION  Patient Name: Frank Jefferson Birthdate: [birth year] Sex: male Admission Date (Current Location): 05/28/2024  [facility] and [place] Number:  Chiropodist and Address:  [facility], [address] [place], [place] [address]      Provider Number: [id number]  Attending Physician Name and Address:  Jhonny Calvin NOVAK, MD  Relative Name and Phone Number:  [name]    Current Level of Care: Hospital Recommended Level of Care: Assisted Living Facility Prior Approval Number:    Date Approved/Denied:   PASRR Number: [id number]  Discharge Plan: SNF    Current Diagnoses: Patient Active Problem List   Diagnosis Date Noted   Acute kidney injury superimposed on stage 3b chronic kidney disease (HCC) 05/28/2024   Type II diabetes mellitus with renal manifestations (HCC) 05/28/2024   HTN (hypertension) 05/28/2024   CAD (coronary artery disease) 05/28/2024   Chronic diastolic CHF (congestive heart failure) (HCC) 05/28/2024   Acute renal failure superimposed on stage 3b chronic kidney disease (HCC) 05/28/2024   Abdominal aortic aneurysm without rupture (HCC) 12/22/2021   Adjustment disorder with mixed anxiety and depressed mood 12/22/2021   Alcohol  abuse 12/22/2021   Dementia (HCC) 12/22/2021   Atherosclerosis of native arteries of extremities with rest pain, bilateral legs (HCC) 12/22/2021   Glaucoma 12/22/2021   Hyperlipidemia 12/22/2021   Hypothyroidism 12/22/2021   Irritable bowel syndrome 12/22/2021   Non-toxic multinodular goiter 12/22/2021   Occlusion and stenosis of bilateral carotid arteries 12/22/2021   Osteoarthritis 12/22/2021   Other long term (current) drug therapy 12/22/2021   Tear film insufficiency 12/22/2021   Type 2 diabetes mellitus without complications (HCC) 12/22/2021   Vitamin D deficiency 12/22/2021   Diabetes mellitus (HCC) 12/20/2021    STEMI (ST elevation myocardial infarction) (HCC) 04/03/2019   Encephalopathy 08/16/2018   Hypomagnesemia 08/16/2018   Peripheral vascular disease (HCC) 08/16/2018   Purulent conjunctivitis of both eyes 08/16/2018   Thrombocytopenia (HCC) 08/16/2018   AKI (acute kidney injury) (HCC) 08/15/2018   Chronic kidney disease, unspecified 11/29/2017   Increased frequency of urination 11/29/2017   Korsakoff syndrome (HCC) 11/29/2017   Shingles 11/29/2017   Alcohol  withdrawal (HCC) 11/21/2017   Chest pain 11/21/2017   Abdominal pain, RLQ (right lower quadrant) 06/30/2015   Cholelithiasis without cholecystitis 04/28/2015   Chronic diarrhea 04/28/2015   Other vitamin B12 deficiency anemias 11/17/2014    Orientation RESPIRATION BLADDER Height & Weight        Normal Continent Weight: 162 lb 14.7 oz (73.9 kg) Height:  5' 7 (170.2 cm)  BEHAVIORAL SYMPTOMS/MOOD NEUROLOGICAL BOWEL NUTRITION STATUS   (None)   Continent Diet  AMBULATORY STATUS COMMUNICATION OF NEEDS Skin   Limited Assist Verbally Normal                       Personal Care Assistance Level of Assistance  Bathing, Feeding, Dressing Bathing Assistance: Limited assistance Feeding assistance: Independent Dressing Assistance: Limited assistance Total Care Assistance: Independent   Functional Limitations Info  Speech, Hearing, Sight Sight Info: Adequate Hearing Info: Adequate Speech Info: Adequate    SPECIAL CARE FACTORS FREQUENCY                       Contractures Contractures Info: Not present    Additional Factors Info  Allergies   Allergies Info: Ace Inhibitor, Ciprofloxacin, Penicillins           Current  Medications (05/31/2024):  This is the current hospital active medication list Current Facility-Administered Medications  Medication Dose Route Frequency Provider Last Rate Last Admin   acetaminophen  (TYLENOL ) tablet 650 mg  650 mg Oral Q6H PRN Niu, Xilin, MD       allopurinol  (ZYLOPRIM ) tablet 100  mg  100 mg Oral Daily Niu, Xilin, MD   100 mg at 05/31/24 0908   amLODipine  (NORVASC ) tablet 10 mg  10 mg Oral Daily Niu, Xilin, MD   10 mg at 05/31/24 9090   artificial tears ophthalmic solution 2 drop  2 drop Both Eyes Daily Niu, Xilin, MD   2 drop at 05/31/24 9089   aspirin  chewable tablet 81 mg  81 mg Oral Daily Niu, Xilin, MD   81 mg at 05/31/24 9090   atorvastatin  (LIPITOR ) tablet 80 mg  80 mg Oral q1800 Niu, Xilin, MD   80 mg at 05/30/24 1834   benzonatate  (TESSALON ) capsule 200 mg  200 mg Oral TID Sreenath, Sudheer B, MD   200 mg at 05/31/24 9090   brimonidine  (ALPHAGAN ) 0.2 % ophthalmic solution 1 drop  1 drop Both Eyes BID Niu, Xilin, MD   1 drop at 05/31/24 9089   calcium  carbonate (OS-CAL - dosed in mg of elemental calcium ) tablet 625 mg  625 mg Oral Q breakfast Niu, Xilin, MD   625 mg at 05/31/24 0908   chlorpheniramine-HYDROcodone  (TUSSIONEX) 10-8 MG/5ML suspension 5 mL  5 mL Oral QHS PRN Sreenath, Sudheer B, MD   5 mL at 05/30/24 1941   cholecalciferol  (VITAMIN D3) tablet 2,000 Units  2,000 Units Oral Daily Niu, Xilin, MD   2,000 Units at 05/31/24 0908   clotrimazole  (LOTRIMIN ) 1 % cream 1 Application  1 Application Topical BID Niu, Xilin, MD   1 Application at 05/31/24 9088   dicyclomine  (BENTYL ) capsule 10 mg  10 mg Oral QID Niu, Xilin, MD   10 mg at 05/31/24 9090   heparin  injection 5,000 Units  5,000 Units Subcutaneous Q8H Niu, Xilin, MD   5,000 Units at 05/31/24 9396   hydrALAZINE  (APRESOLINE ) injection 5 mg  5 mg Intravenous Q2H PRN Niu, Xilin, MD       hydrALAZINE  (APRESOLINE ) tablet 10 mg  10 mg Oral BID Niu, Xilin, MD   10 mg at 05/31/24 9090   lactated ringers  infusion   Intravenous Continuous Jhonny Sahara B, MD   Stopped at 05/31/24 0757   latanoprost  (XALATAN ) 0.005 % ophthalmic solution 1 drop  1 drop Both Eyes QHS Niu, Xilin, MD   1 drop at 05/30/24 2226   magnesium  oxide (MAG-OX) tablet 400 mg  400 mg Oral TID Niu, Xilin, MD   400 mg at 05/31/24 9090   metoprolol   tartrate (LOPRESSOR ) tablet 12.5 mg  12.5 mg Oral BID Niu, Xilin, MD   12.5 mg at 05/31/24 9090   mirtazapine  (REMERON ) tablet 15 mg  15 mg Oral QHS Niu, Xilin, MD   15 mg at 05/30/24 2220   omega-3 acid ethyl esters (LOVAZA ) capsule 1 g  1 g Oral Daily Niu, Xilin, MD   1 g at 05/31/24 9090   ondansetron  (ZOFRAN ) injection 4 mg  4 mg Intravenous Q8H PRN Niu, Xilin, MD       simethicone  (MYLICON) chewable tablet 80 mg  80 mg Oral Q6H PRN Niu, Xilin, MD       thiamine  (VITAMIN B1) tablet 100 mg  100 mg Oral Daily Niu, Xilin, MD   100 mg at 05/31/24 [phone]  traMADol  (ULTRAM ) tablet 50 mg  50 mg Oral Q6H PRN Niu, Xilin, MD         Discharge Medications: Please see discharge summary for a list of discharge medications.  Relevant Imaging Results:  Relevant Lab Results:   Additional Information [id number]  Corrie JINNY Ruts, LCSW

## 2024-05-31 NOTE — TOC Transition Note (Signed)
 Transition of Care [facility]) - Discharge Note   Patient Details  Name: Frank Jefferson MRN: [id number] Date of Birth: 12/20/1930  [facility] CM/SW Contact:  Corrie JINNY Ruts, LCSW Phone Number: 05/31/2024, 10:23 AM   Clinical Narrative:    Chart reviewed. The patient will be going to [facility] living facility. The patient sister-in-law will be transporting him to his placement.   TOC is signing off.         Patient Goals and CMS Choice            Discharge Placement                       Discharge Plan and Services Additional resources added to the After Visit Summary for                                       Social Drivers of Health (SDOH) Interventions SDOH Screenings   Food Insecurity: No Food Insecurity (05/29/2024)  Housing: Low Risk  (05/29/2024)  Transportation Needs: No Transportation Needs (05/29/2024)  Utilities: Not At Risk (05/29/2024)  Social Connections: Moderately Isolated (05/29/2024)  Tobacco Use: High Risk (05/28/2024)     Readmission Risk Interventions     No data to display

## 2024-07-10 ENCOUNTER — Encounter: Attending: Physician Assistant | Admitting: Physician Assistant

## 2024-07-10 DIAGNOSIS — L97811 Non-pressure chronic ulcer of other part of right lower leg limited to breakdown of skin: Secondary | ICD-10-CM | POA: Diagnosis not present

## 2024-07-10 DIAGNOSIS — I7143 Infrarenal abdominal aortic aneurysm, without rupture: Secondary | ICD-10-CM | POA: Insufficient documentation

## 2024-07-10 DIAGNOSIS — I2510 Atherosclerotic heart disease of native coronary artery without angina pectoris: Secondary | ICD-10-CM | POA: Insufficient documentation

## 2024-07-10 DIAGNOSIS — I87331 Chronic venous hypertension (idiopathic) with ulcer and inflammation of right lower extremity: Secondary | ICD-10-CM | POA: Insufficient documentation

## 2024-07-10 DIAGNOSIS — N183 Chronic kidney disease, stage 3 unspecified: Secondary | ICD-10-CM | POA: Insufficient documentation

## 2024-07-10 DIAGNOSIS — I130 Hypertensive heart and chronic kidney disease with heart failure and stage 1 through stage 4 chronic kidney disease, or unspecified chronic kidney disease: Secondary | ICD-10-CM | POA: Diagnosis not present

## 2024-07-10 DIAGNOSIS — I7389 Other specified peripheral vascular diseases: Secondary | ICD-10-CM | POA: Insufficient documentation

## 2024-07-10 DIAGNOSIS — I5042 Chronic combined systolic (congestive) and diastolic (congestive) heart failure: Secondary | ICD-10-CM | POA: Insufficient documentation
